# Patient Record
Sex: Male | Born: 1990 | Race: Black or African American | Hispanic: No | Marital: Single | Smoking: Never smoker
Health system: Southern US, Community
[De-identification: ages and names within clinical notes are randomized; demographics above are authoritative.]

## PROBLEM LIST (undated history)

## (undated) DIAGNOSIS — R109 Unspecified abdominal pain: Secondary | ICD-10-CM

## (undated) HISTORY — DX: Unspecified abdominal pain: R10.9

---
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
## 2010-01-04 ENCOUNTER — Encounter: Admission: RE | Admit: 2010-01-04 | Discharge: 2010-01-04 | Payer: Self-pay | Admitting: General Practice

## 2010-04-27 ENCOUNTER — Emergency Department (HOSPITAL_COMMUNITY)
Admission: EM | Admit: 2010-04-27 | Discharge: 2010-04-27 | Payer: Self-pay | Source: Home / Self Care | Admitting: Emergency Medicine

## 2010-06-22 ENCOUNTER — Emergency Department (HOSPITAL_COMMUNITY): Payer: Self-pay

## 2010-06-22 ENCOUNTER — Emergency Department (HOSPITAL_COMMUNITY)
Admission: EM | Admit: 2010-06-22 | Discharge: 2010-06-22 | Disposition: A | Discharge status: Home / Self Care | Payer: Self-pay | Attending: Emergency Medicine | Admitting: Emergency Medicine

## 2010-06-22 ENCOUNTER — Encounter (HOSPITAL_COMMUNITY): Payer: Self-pay | Admitting: Radiology

## 2010-06-22 DIAGNOSIS — R1031 Right lower quadrant pain: Secondary | ICD-10-CM | POA: Insufficient documentation

## 2010-06-22 DIAGNOSIS — R110 Nausea: Secondary | ICD-10-CM | POA: Insufficient documentation

## 2010-06-22 LAB — DIFFERENTIAL
Eosinophils Absolute: 0.3 10*3/uL (ref 0.0–0.7)
Eosinophils Relative: 7 % — ABNORMAL HIGH (ref 0–5)
Lymphs Abs: 1.7 10*3/uL (ref 0.7–4.0)
Neutro Abs: 1.5 10*3/uL — ABNORMAL LOW (ref 1.7–7.7)
Neutrophils Relative %: 38 % — ABNORMAL LOW (ref 43–77)

## 2010-06-22 LAB — BASIC METABOLIC PANEL
Creatinine, Ser: 0.71 mg/dL (ref 0.4–1.5)
GFR calc non Af Amer: >60 mL/min (ref >60)
Sodium: 136 mEq/L (ref 135–145)

## 2010-06-22 LAB — URINALYSIS, ROUTINE W REFLEX MICROSCOPIC
Glucose, UA: NEGATIVE mg/dL
Ketones, ur: NEGATIVE mg/dL
Protein, ur: NEGATIVE mg/dL
Specific Gravity, Urine: 1.029 (ref 1.005–1.030)
Urobilinogen, UA: 1 mg/dL (ref 0.0–1.0)

## 2010-06-22 LAB — CBC
HCT: 40.4 % (ref 39.0–52.0)
MCHC: 33.4 g/dL (ref 30.0–36.0)
Platelets: 214 10*3/uL (ref 150–400)
RBC: 4.9 MIL/uL (ref 4.22–5.81)
RDW: 12.7 % (ref 11.5–15.5)

## 2010-06-22 MED ORDER — IOHEXOL 300 MG/ML  SOLN
80.0000 mL | Freq: Once | INTRAMUSCULAR | Status: AC | PRN
  Administered 2010-06-22: 80 mL via INTRAVENOUS

## 2010-06-25 LAB — URINALYSIS, ROUTINE W REFLEX MICROSCOPIC
Hgb urine dipstick: NEGATIVE
Protein, ur: NEGATIVE mg/dL
Urobilinogen, UA: 0.2 mg/dL (ref 0.0–1.0)
pH: 7.5 (ref 5.0–8.0)

## 2010-06-25 LAB — GC/CHLAMYDIA PROBE AMP, GENITAL: GC Probe Amp, Genital: NEGATIVE

## 2010-09-21 ENCOUNTER — Emergency Department (HOSPITAL_COMMUNITY): Payer: Medicaid Other

## 2010-09-21 ENCOUNTER — Emergency Department (HOSPITAL_COMMUNITY)
Admission: EM | Admit: 2010-09-21 | Discharge: 2010-09-21 | Disposition: A | Discharge status: Home / Self Care | Payer: Medicaid Other | Attending: Emergency Medicine | Admitting: Emergency Medicine

## 2010-09-21 DIAGNOSIS — R1033 Periumbilical pain: Secondary | ICD-10-CM | POA: Insufficient documentation

## 2010-09-21 DIAGNOSIS — R110 Nausea: Secondary | ICD-10-CM | POA: Insufficient documentation

## 2010-09-21 LAB — COMPREHENSIVE METABOLIC PANEL
ALT: 15 U/L (ref 0–53)
AST: 24 U/L (ref 0–37)
Albumin: 4.2 g/dL (ref 3.5–5.2)
BUN: 16 mg/dL (ref 6–23)
Calcium: 9.3 mg/dL (ref 8.4–10.5)
GFR calc Af Amer: >60 mL/min (ref >60)
Potassium: 4 mEq/L (ref 3.5–5.1)
Total Bilirubin: 0.4 mg/dL (ref 0.3–1.2)
Total Protein: 7.6 g/dL (ref 6.0–8.3)

## 2010-09-21 LAB — CBC
Hemoglobin: 15.5 g/dL (ref 13.0–17.0)
MCH: 27.3 pg (ref 26.0–34.0)
MCHC: 33.1 g/dL (ref 30.0–36.0)
MCV: 82.5 fL (ref 78.0–100.0)

## 2010-09-21 LAB — URINALYSIS, ROUTINE W REFLEX MICROSCOPIC
Leukocytes, UA: NEGATIVE
Nitrite: NEGATIVE
Protein, ur: NEGATIVE mg/dL
Urobilinogen, UA: 0.2 mg/dL (ref 0.0–1.0)
pH: 6 (ref 5.0–8.0)

## 2010-09-21 MED ORDER — IOHEXOL 300 MG/ML  SOLN: 100.0000 mL | Freq: Once | INTRAMUSCULAR | Status: DC | PRN

## 2011-03-19 ENCOUNTER — Encounter (HOSPITAL_COMMUNITY): Payer: Self-pay | Admitting: Emergency Medicine

## 2011-03-19 ENCOUNTER — Emergency Department (HOSPITAL_COMMUNITY)
Admission: EM | Admit: 2011-03-19 | Discharge: 2011-03-19 | Disposition: A | Discharge status: Home / Self Care | Payer: Medicaid Other | Attending: Emergency Medicine | Admitting: Emergency Medicine

## 2011-03-19 ENCOUNTER — Emergency Department (HOSPITAL_COMMUNITY): Payer: Medicaid Other

## 2011-03-19 DIAGNOSIS — R059 Cough, unspecified: Secondary | ICD-10-CM | POA: Insufficient documentation

## 2011-03-19 DIAGNOSIS — J3489 Other specified disorders of nose and nasal sinuses: Secondary | ICD-10-CM | POA: Insufficient documentation

## 2011-03-19 DIAGNOSIS — R6889 Other general symptoms and signs: Secondary | ICD-10-CM | POA: Insufficient documentation

## 2011-03-19 DIAGNOSIS — R110 Nausea: Secondary | ICD-10-CM | POA: Insufficient documentation

## 2011-03-19 DIAGNOSIS — R05 Cough: Secondary | ICD-10-CM | POA: Insufficient documentation

## 2011-03-19 DIAGNOSIS — J111 Influenza due to unidentified influenza virus with other respiratory manifestations: Secondary | ICD-10-CM | POA: Insufficient documentation

## 2011-03-19 DIAGNOSIS — R509 Fever, unspecified: Secondary | ICD-10-CM | POA: Insufficient documentation

## 2011-03-19 DIAGNOSIS — R599 Enlarged lymph nodes, unspecified: Secondary | ICD-10-CM | POA: Insufficient documentation

## 2011-03-19 MED ORDER — PROMETHAZINE-DM 6.25-15 MG/5ML PO SYRP: 5.0000 mL | ORAL_SOLUTION | Freq: Four times a day (QID) | ORAL | Status: AC | PRN

## 2011-03-19 MED ORDER — GUAIFENESIN ER 1200 MG PO TB12: 1.0000 | ORAL_TABLET | Freq: Two times a day (BID) | ORAL | Status: DC

## 2011-03-19 MED ORDER — IBUPROFEN 800 MG PO TABS: 800.0000 mg | ORAL_TABLET | Freq: Three times a day (TID) | ORAL | Status: AC | PRN

## 2011-03-19 NOTE — ED Notes (Signed)
sorethroat cough since monday

## 2011-03-19 NOTE — ED Provider Notes (Signed)
Medical screening examination/treatment/procedure(s) were performed by non-physician practitioner and as supervising physician I was immediately available for consultation/collaboration.   Glynn Octave, MD 03/19/11 [phone]

## 2011-03-19 NOTE — ED Provider Notes (Signed)
History     CSN: [id number] Arrival date & time: 03/19/2011  7:59 AM   First MD Initiated Contact with Patient 03/19/11 [phone]      Chief Complaint  Patient presents with  . URI    (Consider location/radiation/quality/duration/timing/severity/associated sxs/prior treatment) Patient is a 20 y.o. male presenting with URI. The history is provided by the patient.  URI The primary symptoms include fever, sore throat, swollen glands, cough and nausea. Primary symptoms do not include fatigue, headaches, ear pain, wheezing, abdominal pain, vomiting, myalgias or rash.  Symptoms associated with the illness include congestion and rhinorrhea. The illness is not associated with chills.   Patient states the last 2 days he sets cough sore throat runny nose and nasal congestion along with nausea.  Patient denies chest pain shortness of breath, abdominal pain, vomiting, diarrhea, headache, or weakness.  Patient states he is taking ibuprofen for the chills and pain in his throat.  History reviewed. No pertinent past medical history.  History reviewed. No pertinent past surgical history.  No family history on file.  History  Substance Use Topics  . Smoking status: Not on file  . Smokeless tobacco: Not on file  . Alcohol Use: Not on file      Review of Systems  Constitutional: Positive for fever. Negative for chills and fatigue.  HENT: Positive for congestion, sore throat and rhinorrhea. Negative for ear pain.   Respiratory: Positive for cough. Negative for wheezing.   Gastrointestinal: Positive for nausea. Negative for vomiting and abdominal pain.  Musculoskeletal: Negative for myalgias.  Skin: Negative for rash.  Neurological: Negative for headaches.   All pertinent positives and negatives in the history of present illness  Allergies  Review of patient's allergies indicates no known allergies.  Home Medications   Current Outpatient Rx  Name Route Sig Dispense Refill  . IBUPROFEN 200  MG PO TABS Oral Take 400 mg by mouth every 6 (six) hours as needed.        Pulse 106  Temp(Src) 100.1 F (37.8 C) (Oral)  Resp 18  SpO2 99%  Physical Exam  Constitutional: He is oriented to person, place, and time. He appears well-developed and well-nourished. No distress.  HENT:  Head: Normocephalic and atraumatic. No trismus in the jaw.  Right Ear: Tympanic membrane normal.  Left Ear: Tympanic membrane normal.  Nose: Rhinorrhea present.  Mouth/Throat: Uvula is midline and mucous membranes are normal. No uvula swelling. No oropharyngeal exudate, posterior oropharyngeal edema, posterior oropharyngeal erythema or tonsillar abscesses.  Eyes: Pupils are equal, round, and reactive to light.  Cardiovascular: Normal rate, regular rhythm and normal heart sounds.   Pulmonary/Chest: Effort normal and breath sounds normal. He has no wheezes. He has no rales.  Abdominal: Soft. Bowel sounds are normal. He exhibits no distension. There is no tenderness. There is no rebound and no guarding.  Neurological: He is alert and oriented to person, place, and time.  Skin: Skin is warm and dry. No rash noted. He is not diaphoretic.    ED Course  Procedures (including critical care time)   Dg Chest 2 View  03/19/2011  *RADIOLOGY REPORT*  Clinical Data: Cough, fever.  CHEST - 2 VIEW  Comparison: None  Findings: Slight peribronchial thickening. Heart and mediastinal contours are within normal limits.  No focal opacities or effusions.  No acute bony abnormality.  IMPRESSION: Slight bronchitic changes.  Original Report Authenticated By: Cyndie Chime, M.D.     Patient has an influenza-like illness based on his history  of present illness and physical exam findings.  He has normal vital signs other than a low-grade temperature.  Patient is explained that this influenza-like illness and he will need to rest and increase his fluid intake.  He has been stable here in the emergency department during his  visit.    MDM  See above        Carlyle Dolly, PA-C 03/19/11 [phone]

## 2013-04-14 ENCOUNTER — Emergency Department (HOSPITAL_COMMUNITY)
Admission: EM | Admit: 2013-04-14 | Discharge: 2013-04-14 | Disposition: A | Discharge status: Home / Self Care | Payer: Medicaid Other | Attending: Emergency Medicine | Admitting: Emergency Medicine

## 2013-04-14 ENCOUNTER — Emergency Department (HOSPITAL_COMMUNITY): Payer: Medicaid Other

## 2013-04-14 ENCOUNTER — Encounter (HOSPITAL_COMMUNITY): Payer: Self-pay | Admitting: Emergency Medicine

## 2013-04-14 DIAGNOSIS — R61 Generalized hyperhidrosis: Secondary | ICD-10-CM | POA: Insufficient documentation

## 2013-04-14 DIAGNOSIS — J069 Acute upper respiratory infection, unspecified: Secondary | ICD-10-CM | POA: Insufficient documentation

## 2013-04-14 DIAGNOSIS — B9789 Other viral agents as the cause of diseases classified elsewhere: Secondary | ICD-10-CM

## 2013-04-14 DIAGNOSIS — J988 Other specified respiratory disorders: Secondary | ICD-10-CM

## 2013-04-14 DIAGNOSIS — R112 Nausea with vomiting, unspecified: Secondary | ICD-10-CM | POA: Insufficient documentation

## 2013-04-14 DIAGNOSIS — R0789 Other chest pain: Secondary | ICD-10-CM | POA: Insufficient documentation

## 2013-04-14 LAB — BASIC METABOLIC PANEL
BUN: 13 mg/dL (ref 6–23)
CALCIUM: 9.7 mg/dL (ref 8.4–10.5)
CHLORIDE: 97 meq/L (ref 96–112)
CO2: 28 mEq/L (ref 19–32)
CREATININE: 0.92 mg/dL (ref 0.50–1.35)
GLUCOSE: 104 mg/dL — AB (ref 70–99)
Potassium: 3.9 mEq/L (ref 3.7–5.3)
Sodium: 138 mEq/L (ref 137–147)

## 2013-04-14 LAB — CBC
HCT: 47 % (ref 39.0–52.0)
HEMOGLOBIN: 15.5 g/dL (ref 13.0–17.0)
MCH: 28.2 pg (ref 26.0–34.0)
MCHC: 33 g/dL (ref 30.0–36.0)
MCV: 85.5 fL (ref 78.0–100.0)
Platelets: 222 10*3/uL (ref 150–400)
RBC: 5.5 MIL/uL (ref 4.22–5.81)
RDW: 12.4 % (ref 11.5–15.5)
WBC: 4.8 10*3/uL (ref 4.0–10.5)

## 2013-04-14 MED ORDER — IBUPROFEN 800 MG PO TABS: 800.0000 mg | ORAL_TABLET | Freq: Three times a day (TID) | ORAL | Status: DC | PRN

## 2013-04-14 MED ORDER — ALBUTEROL SULFATE HFA 108 (90 BASE) MCG/ACT IN AERS
INHALATION_SPRAY | RESPIRATORY_TRACT | Status: AC
  Filled 2013-04-14: qty 6.7

## 2013-04-14 MED ORDER — HYDROCODONE-HOMATROPINE 5-1.5 MG/5ML PO SYRP
5.0000 mL | ORAL_SOLUTION | Freq: Four times a day (QID) | ORAL | Status: DC | PRN
Start: 2013-04-14 — End: 2013-07-29

## 2013-04-14 MED ORDER — ALBUTEROL SULFATE HFA 108 (90 BASE) MCG/ACT IN AERS
1.0000 | INHALATION_SPRAY | RESPIRATORY_TRACT | Status: DC | PRN
  Administered 2013-04-14: 2 via RESPIRATORY_TRACT

## 2013-04-14 MED ORDER — ALBUTEROL SULFATE HFA 108 (90 BASE) MCG/ACT IN AERS: 1.0000 | INHALATION_SPRAY | Freq: Four times a day (QID) | RESPIRATORY_TRACT | Status: DC | PRN

## 2013-04-14 MED ORDER — IBUPROFEN 800 MG PO TABS
800.0000 mg | ORAL_TABLET | Freq: Once | ORAL | Status: AC
  Administered 2013-04-14: 800 mg via ORAL
  Filled 2013-04-14: qty 1

## 2013-04-14 NOTE — ED Notes (Signed)
Pt c/o productive cough and chest pain with cough since Mon.  Denies fever.  Pt also c/o nausea when he coughs.

## 2013-04-14 NOTE — Discharge Instructions (Signed)
Read the information below.  Use the prescribed medication as directed.  Please discuss all new medications with your pharmacist.  You may return to the Emergency Department at any time for worsening condition or any new symptoms that concern you.  If you develop high fevers that do not resolve with tylenol or ibuprofen, you have difficulty swallowing or breathing, or you are unable to tolerate fluids by mouth, return to the ER for a recheck.    ° ° ° °Viral Infections °A viral infection can be caused by different types of viruses. Most viral infections are not serious and resolve on their own. However, some infections may cause severe symptoms and may lead to further complications. °SYMPTOMS °Viruses can frequently cause: °· Minor sore throat. °· Aches and pains. °· Headaches. °· Runny nose. °· Different types of rashes. °· Watery eyes. °· Tiredness. °· Cough. °· Loss of appetite. °· Gastrointestinal infections, resulting in nausea, vomiting, and diarrhea. °These symptoms do not respond to antibiotics because the infection is not caused by bacteria. However, you might catch a bacterial infection following the viral infection. This is sometimes called a "superinfection." Symptoms of such a bacterial infection may include: °· Worsening sore throat with pus and difficulty swallowing. °· Swollen neck glands. °· Chills and a high or persistent fever. °· Severe headache. °· Tenderness over the sinuses. °· Persistent overall ill feeling (malaise), muscle aches, and tiredness (fatigue). °· Persistent cough. °· Yellow, green, or brown mucus production with coughing. °HOME CARE INSTRUCTIONS  °· Only take over-the-counter or prescription medicines for pain, discomfort, diarrhea, or fever as directed by your caregiver. °· Drink enough water and fluids to keep your urine clear or pale yellow. Sports drinks can provide valuable electrolytes, sugars, and hydration. °· Get plenty of rest and maintain proper nutrition. Soups and  broths with crackers or rice are fine. °SEEK IMMEDIATE MEDICAL CARE IF:  °· You have severe headaches, shortness of breath, chest pain, neck pain, or an unusual rash. °· You have uncontrolled vomiting, diarrhea, or you are unable to keep down fluids. °· You or your child has an oral temperature above 102° F (38.9° C), not controlled by medicine. °· Your baby is older than 3 months with a rectal temperature of 102° F (38.9° C) or higher. °· Your baby is 3 months old or younger with a rectal temperature of 100.4° F (38° C) or higher. °MAKE SURE YOU:  °· Understand these instructions. °· Will watch your condition. °· Will get help right away if you are not doing well or get worse. °Document Released: 01/01/2005 Document Revised: 06/16/2011 Document Reviewed: 07/29/2010 °ExitCare® Patient Information ©2014 [employer]. ° °

## 2013-04-14 NOTE — ED Provider Notes (Signed)
CSN: [id number]     Arrival date & time 04/14/13  1449 History   First MD Initiated Contact with Patient 04/14/13 1745     Chief Complaint  Patient presents with  . Cough  . Chest Pain   (Consider location/radiation/quality/duration/timing/severity/associated sxs/prior Treatment) HPI Patient with cough, burning chest pain that began Monday.  Chest pain only occurs with cough, is 9/10, started after excessive coughing.  Pt has had rhinorrhea with yellow discharge, chills/sweats, nausea, and posttussive emesis.  Denies SOB, ear pain, sinusp ressure, headache, diarrhea.  No blood in his sputum.  Has had a sick contact at work with similar symptoms.  No recent travel.  No leg swelling.   History reviewed. No pertinent past medical history. History reviewed. No pertinent past surgical history. No family history on file. History  Substance Use Topics  . Smoking status: Never Smoker   . Smokeless tobacco: Not on file  . Alcohol Use: No    Review of Systems  Constitutional: Positive for chills and diaphoresis. Negative for fever.  HENT: Positive for congestion and rhinorrhea. Negative for ear pain, sinus pressure, sore throat and trouble swallowing.   Respiratory: Positive for cough. Negative for shortness of breath.   Cardiovascular: Positive for chest pain.  Gastrointestinal: Negative for nausea, vomiting, abdominal pain and diarrhea.  Allergic/Immunologic: Negative for immunocompromised state.  Neurological: Negative for headaches.    Allergies  Review of patient's allergies indicates no known allergies.  Home Medications   Current Outpatient Rx  Name  Route  Sig  Dispense  Refill  . albuterol (PROVENTIL HFA;VENTOLIN HFA) 108 (90 BASE) MCG/ACT inhaler   Inhalation   Inhale 1-2 puffs into the lungs every 6 (six) hours as needed for wheezing or shortness of breath.   1 Inhaler   0   . HYDROcodone-homatropine (HYCODAN) 5-1.5 MG/5ML syrup   Oral   Take 5 mLs by mouth every 6  (six) hours as needed for cough.   120 mL   0   . ibuprofen (ADVIL,MOTRIN) 800 MG tablet   Oral   Take 1 tablet (800 mg total) by mouth every 8 (eight) hours as needed for mild pain or moderate pain.   15 tablet   0    BP 129/55  Pulse 67  Temp(Src) 97.9 F (36.6 C) (Oral)  Resp 18  Ht 5\' 8"  (1.727 m)  Wt 157 lb (71.215 kg)  BMI 23.88 kg/m2  SpO2 100% Physical Exam  Nursing note and vitals reviewed. Constitutional: He appears well-developed and well-nourished. No distress.  HENT:  Head: Normocephalic and atraumatic.  Mouth/Throat: Oropharynx is clear and moist.  Neck: Neck supple.  Cardiovascular: Normal rate and regular rhythm.   Pulmonary/Chest: Effort normal and breath sounds normal. No respiratory distress. He has no wheezes. He has no rales.  Abdominal: Soft. He exhibits no distension and no mass. There is no tenderness. There is no rebound and no guarding.  Neurological: He is alert. He exhibits normal muscle tone.  Skin: He is not diaphoretic.    ED Course  Procedures (including critical care time) Labs Review Labs Reviewed  BASIC METABOLIC PANEL - Abnormal; Notable for the following:    Glucose, Bld 104 (*)    All other components within normal limits  CBC   Imaging Review Dg Chest 2 View (if Patient Has Fever And/or Copd)  04/14/2013   CLINICAL DATA:  Fever on Tuesday, currently no fever, with chest pain and cough for 4 days  EXAM: CHEST  2 VIEW  COMPARISON:  03/19/2011  FINDINGS: The heart size and mediastinal contours are within normal limits. Both lungs are clear. The visualized skeletal structures are unremarkable.  IMPRESSION: No active cardiopulmonary disease.   Electronically Signed   By: Esperanza Heiraymond  Rubner M.D.   On: 04/14/2013 16:11    EKG Interpretation   None       MDM   1. Viral respiratory illness    Afebrile, nontoxic patient with cough, nasal congestion/rhinorrhea, has developed burning chest pain only with coughing.  No SOB.  O2 97% on  room air.  CXR negative.  Likely viral illness.  D/C home with supportive care.  Albuterol, hycodan, ibuprofen. Discussed result, findings, treatment, and follow up  with patient.  Pt given return precautions.  Pt verbalizes understanding and agrees with plan.      I doubt any other EMC precluding discharge at this time including, but not necessarily limited to the following: PE, pneumonia     Trixie Dredgemily Ardyn Forge, PA-C 04/14/13 2010

## 2013-04-15 NOTE — ED Provider Notes (Signed)
Medical screening examination/treatment/procedure(s) were performed by non-physician practitioner and as supervising physician I was immediately available for consultation/collaboration.  EKG Interpretation    Date/Time:  Thursday April 14 2013 15:01:26 EST Ventricular Rate:  74 PR Interval:  146 QRS Duration: 80 QT Interval:  370 QTC Calculation: 410 R Axis:   78 Text Interpretation:  Normal sinus rhythm Normal ECG Confirmed by Lizania Bouchard  MD, Haadi Santellan ([id number]) on 04/15/2013 10:12:53 AM              Randa SpikeForrest Mort SawyersS Alizzon Dioguardi, MD 04/15/13 1013

## 2013-07-29 ENCOUNTER — Emergency Department (HOSPITAL_COMMUNITY)
Admission: EM | Admit: 2013-07-29 | Discharge: 2013-07-29 | Disposition: A | Discharge status: Home / Self Care | Payer: Medicaid Other | Attending: Emergency Medicine | Admitting: Emergency Medicine

## 2013-07-29 ENCOUNTER — Encounter (HOSPITAL_COMMUNITY): Payer: Self-pay | Admitting: Emergency Medicine

## 2013-07-29 DIAGNOSIS — B9689 Other specified bacterial agents as the cause of diseases classified elsewhere: Secondary | ICD-10-CM | POA: Insufficient documentation

## 2013-07-29 DIAGNOSIS — L03213 Periorbital cellulitis: Secondary | ICD-10-CM

## 2013-07-29 DIAGNOSIS — H1031 Unspecified acute conjunctivitis, right eye: Secondary | ICD-10-CM

## 2013-07-29 DIAGNOSIS — H05019 Cellulitis of unspecified orbit: Secondary | ICD-10-CM | POA: Insufficient documentation

## 2013-07-29 DIAGNOSIS — A499 Bacterial infection, unspecified: Secondary | ICD-10-CM | POA: Insufficient documentation

## 2013-07-29 DIAGNOSIS — H1030 Unspecified acute conjunctivitis, unspecified eye: Secondary | ICD-10-CM | POA: Insufficient documentation

## 2013-07-29 MED ORDER — HYDROCODONE-ACETAMINOPHEN 5-325 MG PO TABS: 1.0000 | ORAL_TABLET | Freq: Four times a day (QID) | ORAL | Status: DC | PRN

## 2013-07-29 MED ORDER — CEPHALEXIN 500 MG PO CAPS: 500.0000 mg | ORAL_CAPSULE | Freq: Four times a day (QID) | ORAL | Status: DC

## 2013-07-29 MED ORDER — OFLOXACIN 0.3 % OP SOLN
1.0000 [drp] | Freq: Four times a day (QID) | OPHTHALMIC | Status: DC
  Administered 2013-07-29: 1 [drp] via OPHTHALMIC
  Filled 2013-07-29 (×3): qty 5

## 2013-07-29 NOTE — ED Provider Notes (Signed)
CSN: [id number]     Arrival date & time 07/29/13  1238 History  This chart was scribed for non-physician practitioner, Arthor CaptainAbigail Gwenevere Goga, PA-C, working with Raeford RazorStephen Kohut, MD by Shari HeritageAisha Amuda, ED Scribe. This patient was seen in room TR04C/TR04C and the patient's care was started at 1:17 PM.  Chief Complaint  Patient presents with  . Eye Problem    The history is provided by the patient. No language interpreter was used.    HPI Comments: Lance SellJofred Dirusso is a 23 y.o. male who presents to the Emergency Department complaining of constant, moderate right eyelid pain that began 4 days ago upon waking. He states that pain is worse when he opens his eye. Patient denies any obvious injury or trauma to the eye. There is associated itching, swelling and redness to the eyelid. He is making tears from the right eye. He also reports subjective fever and chills 3 nights ago, but none since then. He denies visual changes, headaches, nausea, vomiting.    History reviewed. No pertinent past medical history. History reviewed. No pertinent past surgical history. No family history on file. History  Substance Use Topics  . Smoking status: Never Smoker   . Smokeless tobacco: Not on file  . Alcohol Use: No    Review of Systems  Constitutional: Positive for fever (subjective) and chills.  Eyes: Positive for pain, redness and itching. Negative for discharge and visual disturbance.  Gastrointestinal: Negative for nausea and vomiting.  Neurological: Negative for headaches.    Allergies  Review of patient's allergies indicates no known allergies.  Home Medications   Prior to Admission medications   Medication Sig Start Date End Date Taking? Authorizing Provider  albuterol (PROVENTIL HFA;VENTOLIN HFA) 108 (90 BASE) MCG/ACT inhaler Inhale 1-2 puffs into the lungs every 6 (six) hours as needed for wheezing or shortness of breath. 04/14/13   Trixie DredgeEmily West, PA-C  HYDROcodone-homatropine [facility](HYCODAN) 5-1.5 MG/5ML syrup Take  5 mLs by mouth every 6 (six) hours as needed for cough. 04/14/13   Trixie DredgeEmily West, PA-C  ibuprofen (ADVIL,MOTRIN) 800 MG tablet Take 1 tablet (800 mg total) by mouth every 8 (eight) hours as needed for mild pain or moderate pain. 04/14/13   Trixie DredgeEmily West, PA-C   Triage Vitals: BP 119/78  Pulse 68  Temp(Src) 98 F (36.7 C) (Oral)  SpO2 100% Physical Exam  Constitutional: He is oriented to person, place, and time. He appears well-developed and well-nourished.  HENT:  Head: Normocephalic and atraumatic.  Mouth/Throat: Oropharynx is clear and moist. No oropharyngeal exudate.  Eyes: EOM are normal.  Purulence under right eyelid. EOM intact without pain. Exam is difficult secondary to pain.  Neck: Normal range of motion. Neck supple.  Cardiovascular: Normal rate.   Pulmonary/Chest: Effort normal.  Musculoskeletal: Normal range of motion.  Neurological: He is alert and oriented to person, place, and time.  Skin: Skin is warm and dry. No rash noted.  Psychiatric: He has a normal mood and affect. His behavior is normal.    ED Course  Procedures (including critical care time) DIAGNOSTIC STUDIES: Oxygen Saturation is 100% on room air, normal by my interpretation.    COORDINATION OF CARE: 1:21 PM- Patient informed of current plan for treatment and evaluation and agrees with plan at this time.    MDM   Final diagnoses:  Acute bacterial conjunctivitis of right eye  Preseptal cellulitis of right eye    Patient presents with right eyelid pain, swelling and itching. Patient is producing tears from right eye. Able  to move eye without pain and purulence noted underneath right eyelid on physical exam. Will treat for bacterial conjunctivitis and preseptal cellulitis with ofloxacin. Patient advised of return precautions.   I personally performed the services described in this documentation, which was scribed in my presence. The recorded information has been reviewed and considered.  Arthor CaptainAbigail Trivia Heffelfinger,  PA-C 07/31/13 2001

## 2013-07-29 NOTE — ED Notes (Signed)
Woke up 2 days ago with right eyelid swelling and itching. Now very tender to touch.

## 2013-07-29 NOTE — Discharge Instructions (Signed)
Concerning your eye drops : Instill 1drops in affected eye every 4 hours for the first 2 days, then use 4 times/day for an additional 5 days.    Bacterial Conjunctivitis Bacterial conjunctivitis, commonly called pink eye, is an inflammation of the clear membrane that covers the white part of the eye (conjunctiva). The inflammation can also happen on the underside of the eyelids. The blood vessels in the conjunctiva become inflamed causing the eye to become red or pink. Bacterial conjunctivitis may spread easily from one eye to another and from person to person (contagious).  CAUSES  Bacterial conjunctivitis is caused by bacteria. The bacteria may come from your own skin, your upper respiratory tract, or from someone else with bacterial conjunctivitis. SYMPTOMS  The normally white color of the eye or the underside of the eyelid is usually pink or red. The pink eye is usually associated with irritation, tearing, and some sensitivity to light. Bacterial conjunctivitis is often associated with a thick, yellowish discharge from the eye. The discharge may turn into a crust on the eyelids overnight, which causes your eyelids to stick together. If a discharge is present, there may also be some blurred vision in the affected eye. DIAGNOSIS  Bacterial conjunctivitis is diagnosed by your caregiver through an eye exam and the symptoms that you report. Your caregiver looks for changes in the surface tissues of your eyes, which may point to the specific type of conjunctivitis. A sample of any discharge may be collected on a cotton-tip swab if you have a severe case of conjunctivitis, if your cornea is affected, or if you keep getting repeat infections that do not respond to treatment. The sample will be sent to a lab to see if the inflammation is caused by a bacterial infection and to see if the infection will respond to antibiotic medicines. TREATMENT   Bacterial conjunctivitis is treated with antibiotics.  Antibiotic eyedrops are most often used. However, antibiotic ointments are also available. Antibiotics pills are sometimes used. Artificial tears or eye washes may ease discomfort. HOME CARE INSTRUCTIONS   To ease discomfort, apply a cool, clean wash cloth to your eye for 10 20 minutes, 3 4 times a day.  Gently wipe away any drainage from your eye with a warm, wet washcloth or a cotton ball.  Wash your hands often with soap and water. Use paper towels to dry your hands.  Do not share towels or wash cloths. This may spread the infection.  Change or wash your pillow case every day.  You should not use eye makeup until the infection is gone.  Do not operate machinery or drive if your vision is blurred.  Stop using contacts lenses. Ask your caregiver how to sterilize or replace your contacts before using them again. This depends on the type of contact lenses that you use.  When applying medicine to the infected eye, do not touch the edge of your eyelid with the eyedrop bottle or ointment tube. SEEK IMMEDIATE MEDICAL CARE IF:   Your infection has not improved within 3 days after beginning treatment.  You had yellow discharge from your eye and it returns.  You have increased eye pain.  Your eye redness is spreading.  Your vision becomes blurred.  You have a fever or persistent symptoms for more than 2 3 days.  You have a fever and your symptoms suddenly get worse.  You have facial pain, redness, or swelling. MAKE SURE YOU:   Understand these instructions.  Will watch your condition.  Will get help right away if you are not doing well or get worse. Document Released: 03/24/2005 Document Revised: 12/17/2011 Document Reviewed: 08/25/2011 [facility] Patient Information 2014 [place], [place].  Periorbital Cellulitis Periorbital cellulitis is a common infection that can affect the eyelid and the soft tissues that surround the eyeball. The infection may also affect the structures that  produce and drain tears. It does not affect the eyeball itself. Natural tissue barriers usually prevent the spread of this infection to the eyeball and other deeper areas of the eye socket.  CAUSES  Bacterial infection.  Long-term (chronic) sinus infections.  An object (foreign body) stuck behind the eye.  An injury that goes through the eyelid tissues.  An injury that causes an infection, such as an insect sting.  Fracture of the bone around the eye.  Infections which have spread from the eyelid or other structures around the eye.  Bite wounds.  Inflammation or infection of the lining membranes of the brain (meningitis).  An infection in the blood (septicemia).  Dental infection (abscess).  Viral infection (this is rare). SYMPTOMS Symptoms usually come on suddenly.  Pain in the eye.  Red, hot, and swollen eyelids and possibly cheeks. The swelling is sometimes bad enough that the eyelids cannot open. Some infections make the eyelids look purple.  Fever and feeling generally ill.  Pain when touching the area around the eye. DIAGNOSIS  Periorbital cellulitis can be diagnosed from an eye exam. In severe cases, your caregiver might suggest:  Blood tests.  Imaging tests (such as a CT scan) to examine the sinuses and the area around and behind the eyeball. TREATMENT If your caregiver feels that you do not have any signs of serious infection, treatment may include:  Antibiotics.  Nasal decongestants to reduce swelling.  Referral to a dentist if it is suspected that the infection was caused by a prior tooth infection.  Examination every day to make sure the problem is improving. HOME CARE INSTRUCTIONS  Take your antibiotics as directed. Finish them even if you start to feel better.  Some pain is normal with this condition. Take pain medicine as directed by your caregiver. Only take pain medicines approved by your caregiver.  It is important to drink fluids. Drink  enough water and fluids to keep your urine clear or pale yellow.  Do not smoke.  Rest and get plenty of sleep.  Mild or moderate fevers generally have no long-term effects and often do not require treatment.  If your caregiver has given you a follow-up appointment, it is very important to keep that appointment. Your caregiver will need to make sure that the infection is getting better. It is important to check that a more serious infection is not developing. SEEK IMMEDIATE MEDICAL CARE IF:  Your eyelids become more painful, red, warm, or swollen.  You develop double vision or your vision becomes blurred or worsens in any way.  You have trouble moving your eyes.  The eye looks like it is popping out (proptosis).  You develop a severe headache, severe neck pain, or neck stiffness.  You develop repeated vomiting.  You have a fever or persistent symptoms for more than 72 hours.  You have a fever and your symptoms suddenly get worse. MAKE SURE YOU:  Understand these instructions.  Will watch your condition.  Will get help right away if you are not doing well or get worse. Document Released: 04/26/2010 Document Revised: 06/16/2011 Document Reviewed: 04/26/2010 [facility] CenterExit[facility] Patient Information 2014 [place]ExitCare, [place].

## 2013-07-29 NOTE — ED Notes (Signed)
Called down to pharmacy about eye gtts.

## 2013-08-02 NOTE — ED Provider Notes (Signed)
Medical screening examination/treatment/procedure(s) were performed by non-physician practitioner and as supervising physician I was immediately available for consultation/collaboration.   EKG Interpretation None       Raeford RazorStephen Larah Kuntzman, MD 08/02/13 1446

## 2015-12-27 ENCOUNTER — Ambulatory Visit (INDEPENDENT_AMBULATORY_CARE_PROVIDER_SITE_OTHER): Payer: PRIVATE HEALTH INSURANCE | Admitting: Urology

## 2015-12-27 ENCOUNTER — Encounter: Payer: Self-pay | Admitting: Urology

## 2015-12-27 VITALS — BP 128/61 | HR 62 | Ht 69.0 in | Wt 151.3 lb

## 2015-12-27 DIAGNOSIS — M545 Low back pain, unspecified: Secondary | ICD-10-CM

## 2015-12-27 DIAGNOSIS — R809 Proteinuria, unspecified: Secondary | ICD-10-CM | POA: Diagnosis not present

## 2015-12-27 LAB — URINALYSIS, COMPLETE
BILIRUBIN UA: NEGATIVE
Glucose, UA: NEGATIVE
Ketones, UA: NEGATIVE
Leukocytes, UA: NEGATIVE
Nitrite, UA: NEGATIVE
PH UA: 5.5 (ref 5.0–7.5)
RBC, UA: NEGATIVE
Specific Gravity, UA: >1.03 — ABNORMAL HIGH (ref 1.005–1.030)
UUROB: 2 mg/dL — AB (ref 0.2–1.0)

## 2015-12-27 LAB — MICROSCOPIC EXAMINATION
Bacteria, UA: NONE SEEN
Epithelial Cells (non renal): NONE SEEN /hpf (ref 0–10)

## 2015-12-27 NOTE — Progress Notes (Signed)
12/27/2015 8:53 AM   Yancey Newman Pies 23-Mar-1991 [id number]  Referring provider: Jonna Clark, MD [address] [place], [place] [address]  Chief Complaint  Patient presents with  . New Patient (Initial Visit)    proteinuria and flank pain referred by [facility]    HPI: Patient is a 25 year old [place] male who was seen at [facility] and was told he had protein in his urine and needed to follow up with urology.    He went to the [facility] after experiencing right flank pain that radiated into the abdomen with a foul smell to his urine.  He had been having unprotected sex and was worried that he may have contracted a STI.   STI testing returned negative, but he had protein on his urine dip.  He was instructed to follow up with urology for the right flank pain.  Today, he is not experiencing any difficulty with urination.  He has not had any penile discharge.  He has not experienced dysuria, gross hematuria or suprapubic pain.  He is not experiencing fevers, chills, nausea or vomiting.  When asked about his pain, he states he has had the pain off and on for several years.  He had attributed it to playing soccer.  He is now very worried as he was told he might have something wrong with his kidneys.  I had him point to the area on his back were he is experiencing the pain.  He pointed to the lower left back at this visit.  He does not have radiation into his lower legs.  He has not had a history of kidney stones and he denies any history of kidney stones in his family.  His UA did not demonstrate any casts or other significant findings.  He had a trace of protein on the dip, but he had 0-5 WBC's, 0-2 RBC's and mucus in the sample.     PMH: Past Medical History:  Diagnosis Date  . Flank pain     Surgical History: History reviewed. No pertinent surgical history.  Home Medications:    Medication List       Accurate as of 12/27/15 11:59 PM. Always use your most recent med list.            cephALEXin 500 MG capsule Commonly known as:  KEFLEX Take 1 capsule (500 mg total) by mouth 4 (four) times daily.   HYDROcodone-acetaminophen 5-325 MG tablet Commonly known as:  NORCO Take 1-2 tablets by mouth every 6 (six) hours as needed.       Allergies: No Known Allergies  Family History: Family History  Problem Relation Age of Onset  . Kidney disease Neg Hx   . Prostate cancer Neg Hx     Social History:  reports that he has never smoked. He has never used smokeless tobacco. He reports that he does not drink alcohol or use drugs.  ROS: UROLOGY Frequent Urination?: No Hard to postpone urination?: No Burning/pain with urination?: No Get up at night to urinate?: No Leakage of urine?: No Urine stream starts and stops?: No Trouble starting stream?: No Do you have to strain to urinate?: No Blood in urine?: No Urinary tract infection?: No Sexually transmitted disease?: No Injury to kidneys or bladder?: No Painful intercourse?: No Weak stream?: No Erection problems?: No Penile pain?: No  Gastrointestinal Nausea?: No Vomiting?: No Indigestion/heartburn?: No Diarrhea?: No Constipation?: No  Constitutional Fever: No Night sweats?: No Weight loss?: No Fatigue?:  No  Skin Skin rash/lesions?: No Itching?: No  Eyes Blurred vision?: No Double vision?: No  Ears/Nose/Throat Sore throat?: No Sinus problems?: No  Hematologic/Lymphatic Swollen glands?: No Easy bruising?: No  Cardiovascular Leg swelling?: No Chest pain?: No  Respiratory Cough?: No Shortness of breath?: No  Endocrine Excessive thirst?: No  Musculoskeletal Back pain?: Yes Joint pain?: No  Neurological Headaches?: No Dizziness?: No  Psychologic Depression?: No Anxiety?: No  Physical Exam: BP 128/61   Pulse 62   Ht 5\' 9"  (1.753 m)   Wt 151 lb 4.8 oz (68.6 kg)   BMI 22.34 kg/m   Constitutional: Well nourished. Alert and oriented, No acute distress. HEENT: [place]  AT, moist mucus membranes. Trachea midline, no masses. Cardiovascular: No clubbing, cyanosis, or edema. Respiratory: Normal respiratory effort, no increased work of breathing. GI: Abdomen is soft, non tender, non distended, no abdominal masses. Liver and spleen not palpable.  No hernias appreciated.  Stool sample for occult testing is not indicated.   GU: No CVA tenderness.  No bladder fullness or masses.  Patient with circumcised phallus.   Urethral meatus is patent.  No penile discharge. No penile lesions or rashes. Scrotum without lesions, cysts, rashes and/or edema.  Testicles are located scrotally bilaterally. No masses are appreciated in the testicles. Left and right epididymis are normal. Rectal: Deferred.   Skin: No rashes, bruises or suspicious lesions. Lymph: No cervical or inguinal adenopathy. Neurologic: Grossly intact, no focal deficits, moving all 4 extremities. Psychiatric: Normal mood and affect.  Patient started to complain that his back was starting to hurt after the exam.    Laboratory Data: Lab Results  Component Value Date   WBC 4.8 04/14/2013   HGB 15.5 04/14/2013   HCT 47.0 04/14/2013   MCV 85.5 04/14/2013   PLT 222 04/14/2013    Lab Results  Component Value Date   CREATININE 0.91 12/27/2015    Lab Results  Component Value Date   AST 15 12/27/2015   Lab Results  Component Value Date   ALT 14 12/27/2015    Urinalysis Unremarkable.  See EPIC and HPI.    Assessment & Plan:    1. Lower back pain  - today's visit patient complained of lower left back pain  - question of musculoskeletal origin as pain increased when he stood for exam    2. Proteinuria  - no casts seen in UA  - Urinalysis, Complete   - CULTURE, URINE COMPREHENSIVE  - check CMP   Return for pending labs.  These notes generated with voice recognition software. I apologize for typographical errors.  Michiel CowboySHANNON Kalecia Hartney, PA-C  [facility] [address], [address] [place], [place] [address] [phone]

## 2015-12-28 ENCOUNTER — Telehealth: Payer: Self-pay

## 2015-12-28 DIAGNOSIS — R109 Unspecified abdominal pain: Secondary | ICD-10-CM

## 2015-12-28 LAB — COMPREHENSIVE METABOLIC PANEL
A/G RATIO: 1.7 (ref 1.2–2.2)
ALT: 14 IU/L (ref 0–44)
AST: 15 IU/L (ref 0–40)
Albumin: 4.6 g/dL (ref 3.5–5.5)
Alkaline Phosphatase: 86 IU/L (ref 39–117)
BUN/Creatinine Ratio: 15 (ref 9–20)
BUN: 14 mg/dL (ref 6–20)
Bilirubin Total: 0.7 mg/dL (ref 0.0–1.2)
CO2: 27 mmol/L (ref 18–29)
CREATININE: 0.91 mg/dL (ref 0.76–1.27)
Calcium: 9.9 mg/dL (ref 8.7–10.2)
Chloride: 99 mmol/L (ref 96–106)
GFR calc non Af Amer: 118 mL/min/{1.73_m2} (ref >59)
GFR, EST AFRICAN AMERICAN: 136 mL/min/{1.73_m2} (ref >59)
Globulin, Total: 2.7 g/dL (ref 1.5–4.5)
Glucose: 82 mg/dL (ref 65–99)
POTASSIUM: 4.1 mmol/L (ref 3.5–5.2)
Sodium: 141 mmol/L (ref 134–144)
Total Protein: 7.3 g/dL (ref 6.0–8.5)

## 2015-12-28 NOTE — Telephone Encounter (Signed)
Spoke with pt in reference to RUS and flank pain. Pt voiced understanding.

## 2015-12-28 NOTE — Telephone Encounter (Signed)
-----   Message from Harle BattiestShannon A McGowan, PA-C sent at 12/28/2015  8:36 AM EDT ----- Patient's blood work is normal.

## 2015-12-28 NOTE — Telephone Encounter (Signed)
Spoke with pt in reference to lab results. Pt stated that he continues to have pain in his back. Please advise.

## 2015-12-28 NOTE — Telephone Encounter (Signed)
The pain is mostly likely musculoskeletal.  We can obtain a RUS to take an internal look of his kidneys.

## 2015-12-31 ENCOUNTER — Encounter: Payer: Self-pay | Admitting: Urology

## 2015-12-31 ENCOUNTER — Telehealth: Payer: Self-pay

## 2015-12-31 LAB — CULTURE, URINE COMPREHENSIVE

## 2015-12-31 NOTE — Telephone Encounter (Signed)
Spoke with pt in reference to ucx results. Pt voiced understanding.  

## 2015-12-31 NOTE — Telephone Encounter (Signed)
-----   Message from Harle BattiestShannon A McGowan, PA-C sent at 12/31/2015 12:52 PM EDT ----- This is a skin contaminate and does not need to be treated.

## 2016-05-21 ENCOUNTER — Other Ambulatory Visit: Payer: Self-pay

## 2017-12-08 ENCOUNTER — Emergency Department: Payer: Self-pay

## 2017-12-08 ENCOUNTER — Encounter: Payer: Self-pay | Admitting: Emergency Medicine

## 2017-12-08 ENCOUNTER — Inpatient Hospital Stay
Admission: EM | Admit: 2017-12-08 | Discharge: 2017-12-09 | DRG: 153 | Disposition: A | Discharge status: Home / Self Care | Payer: Self-pay | Attending: Family Medicine | Admitting: Family Medicine

## 2017-12-08 ENCOUNTER — Other Ambulatory Visit: Payer: Self-pay

## 2017-12-08 DIAGNOSIS — J020 Streptococcal pharyngitis: Principal | ICD-10-CM | POA: Diagnosis present

## 2017-12-08 DIAGNOSIS — R509 Fever, unspecified: Secondary | ICD-10-CM | POA: Diagnosis present

## 2017-12-08 LAB — CBC WITH DIFFERENTIAL/PLATELET
BASOS PCT: 0 %
Basophils Absolute: 0 10*3/uL (ref 0–0.1)
EOS ABS: 0 10*3/uL (ref 0–0.7)
Eosinophils Relative: 0 %
HCT: 41.9 % (ref 40.0–52.0)
Hemoglobin: 14 g/dL (ref 13.0–18.0)
LYMPHS PCT: 5 %
Lymphs Abs: 0.4 10*3/uL — ABNORMAL LOW (ref 1.0–3.6)
MCH: 27.5 pg (ref 26.0–34.0)
MCHC: 33.4 g/dL (ref 32.0–36.0)
MCV: 82.1 fL (ref 80.0–100.0)
MONO ABS: 0.5 10*3/uL (ref 0.2–1.0)
Monocytes Relative: 6 %
Neutro Abs: 7.2 10*3/uL — ABNORMAL HIGH (ref 1.4–6.5)
Neutrophils Relative %: 89 %
Platelets: 183 10*3/uL (ref 150–440)
RBC: 5.1 MIL/uL (ref 4.40–5.90)
RDW: 13.5 % (ref 11.5–14.5)
WBC: 8.2 10*3/uL (ref 3.8–10.6)

## 2017-12-08 LAB — COMPREHENSIVE METABOLIC PANEL
ALBUMIN: 4.6 g/dL (ref 3.5–5.0)
ALT: 23 U/L (ref 0–44)
AST: 26 U/L (ref 15–41)
Alkaline Phosphatase: 77 U/L (ref 38–126)
Anion gap: 8 (ref 5–15)
BUN: 18 mg/dL (ref 6–20)
CHLORIDE: 103 mmol/L (ref 98–111)
CO2: 25 mmol/L (ref 22–32)
CREATININE: 1.17 mg/dL (ref 0.61–1.24)
Calcium: 9.7 mg/dL (ref 8.9–10.3)
GFR calc Af Amer: >60 mL/min (ref >60)
GFR calc non Af Amer: >60 mL/min (ref >60)
GLUCOSE: 115 mg/dL — AB (ref 70–99)
POTASSIUM: 3.7 mmol/L (ref 3.5–5.1)
SODIUM: 136 mmol/L (ref 135–145)
Total Bilirubin: 1.2 mg/dL (ref 0.3–1.2)
Total Protein: 8.1 g/dL (ref 6.5–8.1)

## 2017-12-08 LAB — URINALYSIS, COMPLETE (UACMP) WITH MICROSCOPIC
BILIRUBIN URINE: NEGATIVE
Bacteria, UA: NONE SEEN
Glucose, UA: NEGATIVE mg/dL
HGB URINE DIPSTICK: NEGATIVE
Ketones, ur: 5 mg/dL — AB
Leukocytes, UA: NEGATIVE
Nitrite: NEGATIVE
Protein, ur: NEGATIVE mg/dL
SPECIFIC GRAVITY, URINE: 1.025 (ref 1.005–1.030)
pH: 8 (ref 5.0–8.0)

## 2017-12-08 LAB — LACTIC ACID, PLASMA
LACTIC ACID, VENOUS: 1.4 mmol/L (ref 0.5–1.9)
LACTIC ACID, VENOUS: 2.4 mmol/L — AB (ref 0.5–1.9)

## 2017-12-08 LAB — TECHNOLOGIST SMEAR REVIEW

## 2017-12-08 MED ORDER — BISACODYL 5 MG PO TBEC: 5.0000 mg | DELAYED_RELEASE_TABLET | Freq: Every day | ORAL | Status: DC | PRN

## 2017-12-08 MED ORDER — SODIUM CHLORIDE 0.9 % IV BOLUS
1000.0000 mL | Freq: Once | INTRAVENOUS | Status: AC
  Administered 2017-12-08: 1000 mL via INTRAVENOUS

## 2017-12-08 MED ORDER — ONDANSETRON HCL 4 MG PO TABS: 4.0000 mg | ORAL_TABLET | Freq: Four times a day (QID) | ORAL | Status: DC | PRN

## 2017-12-08 MED ORDER — ACETAMINOPHEN 325 MG PO TABS
650.0000 mg | ORAL_TABLET | Freq: Four times a day (QID) | ORAL | Status: DC | PRN
  Administered 2017-12-09: 650 mg via ORAL
  Filled 2017-12-08: qty 2

## 2017-12-08 MED ORDER — ACETAMINOPHEN 650 MG RE SUPP: 650.0000 mg | Freq: Four times a day (QID) | RECTAL | Status: DC | PRN

## 2017-12-08 MED ORDER — IBUPROFEN 600 MG PO TABS
600.0000 mg | ORAL_TABLET | Freq: Once | ORAL | Status: AC
  Administered 2017-12-08: 600 mg via ORAL
  Filled 2017-12-08: qty 1

## 2017-12-08 MED ORDER — SODIUM CHLORIDE 0.9 % IV SOLN
INTRAVENOUS | Status: DC
  Administered 2017-12-09 (×2): via INTRAVENOUS

## 2017-12-08 MED ORDER — DOCUSATE SODIUM 100 MG PO CAPS: 100.0000 mg | ORAL_CAPSULE | Freq: Two times a day (BID) | ORAL | Status: DC

## 2017-12-08 MED ORDER — HYDROCODONE-ACETAMINOPHEN 5-325 MG PO TABS: 1.0000 | ORAL_TABLET | ORAL | Status: DC | PRN

## 2017-12-08 MED ORDER — IOPAMIDOL (ISOVUE-300) INJECTION 61%
30.0000 mL | Freq: Once | INTRAVENOUS | Status: AC | PRN
  Administered 2017-12-08: 30 mL via ORAL

## 2017-12-08 MED ORDER — ONDANSETRON HCL 4 MG/2ML IJ SOLN: 4.0000 mg | Freq: Four times a day (QID) | INTRAMUSCULAR | Status: DC | PRN

## 2017-12-08 MED ORDER — HEPARIN SODIUM (PORCINE) 5000 UNIT/ML IJ SOLN
5000.0000 [IU] | Freq: Three times a day (TID) | INTRAMUSCULAR | Status: DC
  Filled 2017-12-08: qty 1

## 2017-12-08 MED ORDER — IOPAMIDOL (ISOVUE-300) INJECTION 61%
100.0000 mL | Freq: Once | INTRAVENOUS | Status: AC | PRN
  Administered 2017-12-08: 100 mL via INTRAVENOUS

## 2017-12-08 NOTE — ED Provider Notes (Addendum)
[facility] Emergency Department Provider Note  ____________________________________________   I have reviewed the triage vital signs and the nursing notes. Where available I have reviewed prior notes and, if possible and indicated, outside hospital notes.    HISTORY  Chief Complaint Fever and Nausea    HPI Roger Walker is a 27 y.o. male who is healthy at baseline, states he was recently in [place], [place]  Patient states he took no prophylaxis for malaria and had malaria x1 while he was there.  Been feeling good since he came home, he left there on July 29 which is outside of the end of Ebola, and has been doing well since then.  He has not been around anyone who has been sick since then.  Yesterday he began to have chills and today he had some vomiting nonbloody, no diarrhea, he states that he has had a fever since yesterday.  Denies significant headache.  Feels that he likely has malaria again.  He was seen at [facility] urgent care who found him to have a fever of 103, and they placed him a private vehicle and send him to the [facility] where he came in.  On arrival, I was already talking to Dr. Marcha Dutton of our factious disease program.  She feels strongly that as the patient's last known exposure to [place] in general was July 29 and as he has not been around anyone else who is sick, and as he was not in an endemic area, we do not need to worry about ebola for this patient No known Ebola exposure   Past Medical History:  Diagnosis Date  . Flank pain     There are no active problems to display for this patient.   History reviewed. No pertinent surgical history.  Prior to Admission medications   Not on File    Allergies Patient has no known allergies.  Family History  Problem Relation Age of Onset  . Kidney disease Neg Hx   . Prostate cancer Neg Hx     Social History Social  History   Tobacco Use  . Smoking status: Never Smoker  . Smokeless tobacco: Never Used  Substance Use Topics  . Alcohol use: No  . Drug use: No    Review of Systems Constitutional: + fever/chills Eyes: No visual changes. ENT: No sore throat. No stiff neck no neck pain Cardiovascular: Denies chest pain. Respiratory: Denies shortness of breath. Gastrointestinal:   + vomiting.  Hematemesis no diarrhea.  No constipation. Genitourinary: Negative for dysuria. Musculoskeletal: Negative lower extremity swelling Skin: Negative for rash. Neurological: Negative for severe headaches, focal weakness or numbness.   ____________________________________________   PHYSICAL EXAM:  VITAL SIGNS: ED Triage Vitals  Enc Vitals Group     BP 12/08/17 1730 126/75     Pulse Rate 12/08/17 1732 92     Resp 12/08/17 1730 (!) 27     Temp 12/08/17 1732 (!) 103.1 F (39.5 C)     Temp src --      SpO2 12/08/17 1732 100 %     Weight 12/08/17 1734 179 lb (81.2 kg)     Height --      Head Circumference --      Peak Flow --      Pain Score 12/08/17 1738 10     Pain Loc --      Pain Edu? --      Excl. in GC? --  Constitutional: Alert and oriented. Well appearing and in no acute distress. Eyes: Conjunctivae are normal Head: Atraumatic HEENT: No congestion/rhinnorhea. Mucous membranes are moist.  Oropharynx non-erythematous Neck:   Nontender with no meningismus, no masses, no stridor Cardiovascular: Normal rate, regular rhythm. Grossly normal heart sounds.  Good peripheral circulation. Respiratory: Normal respiratory effort.  No retractions. Lungs CTAB. Abdominal: Soft and focal tenderness noted to the right lower quadrant. No distention. No guarding no rebound Back:  There is no focal tenderness or step off.  there is no midline tenderness there are no lesions noted. there is no CVA tenderness Musculoskeletal: No lower extremity tenderness, no upper extremity tenderness. No joint effusions, no  DVT signs strong distal pulses no edema Neurologic:  Normal speech and language. No gross focal neurologic deficits are appreciated.  Skin:  Skin is warm, dry and intact. No rash noted. Psychiatric: Mood and affect are normal. Speech and behavior are normal.  ____________________________________________   LABS (all labs ordered are listed, but only abnormal results are displayed)  Labs Reviewed  CULTURE, BLOOD (ROUTINE X 2)  CULTURE, BLOOD (ROUTINE X 2)  URINE CULTURE  LACTIC ACID, PLASMA  LACTIC ACID, PLASMA  CBC WITH DIFFERENTIAL/PLATELET  COMPREHENSIVE METABOLIC PANEL  TECHNOLOGIST SMEAR REVIEW  URINALYSIS, COMPLETE (UACMP) WITH MICROSCOPIC    Pertinent labs  results that were available during my care of the patient were reviewed by me and considered in my medical decision making (see chart for details). ____________________________________________  EKG  I personally interpreted any EKGs ordered by me or triage  ____________________________________________  RADIOLOGY  Pertinent labs & imaging results that were available during my care of the patient were reviewed by me and considered in my medical decision making (see chart for details). If possible, patient and/or family made aware of any abnormal findings.  No results found. ____________________________________________    PROCEDURES  Procedure(s) performed: None  Procedures  Critical Care performed: None  ____________________________________________   INITIAL IMPRESSION / ASSESSMENT AND PLAN / ED COURSE  Pertinent labs & imaging results that were available during my care of the patient were reviewed by me and considered in my medical decision making (see chart for details).  Patient here with a recent trip to [place] with fever and vomiting.  Unclear etiology.  Did have recent malaria could certainly be malaria.  Also could just be a transitory viral illness.  What is not thought likely is Ebola, nonetheless  we are keeping him on precautions including respiratory and contact while we do blood work.  Vital signs are reassuring.  He already received antipyretics and will give him more.  We will give him IV fluid.  Very much appreciate the assistance with infectious disease.  Patient is in a negative pressure room.  This may serve as a good test of our systems if nothing else.  Patient clinically does not appear to be significantly toxic.  We will check after discussion with infectious disease and including CBC and cultures and we will reassess.  She does have focal right lower quadrant pain, he does not have a surgical abdomen but he will require imaging to rule out appendicitis as well  ----------------------------------------- 9:26 PM on 12/08/2017 -----------------------------------------  D/w dr. Rivka Safer, who agrees w/ mgt and no abx at this time. He is feeling better.   ____________________________________________   FINAL CLINICAL IMPRESSION(S) / ED DIAGNOSES  Final diagnoses:  None      This chart was dictated using voice recognition software.  Despite best efforts to proofread,  errors can occur which can change meaning.      Jeanmarie Plant, MD 12/08/17 1807    Jeanmarie Plant, MD 12/08/17 Merrily Brittle    Jeanmarie Plant, MD 12/08/17 1816    Jeanmarie Plant, MD 12/08/17 2129

## 2017-12-08 NOTE — H&P (Signed)
[facility] [place] [facility]   PATIENT NAME: Roger Walker    MR#:  [id number]  DATE OF BIRTH:  27-Jun-1990  DATE OF ADMISSION:  12/08/2017  PRIMARY CARE PHYSICIAN: Patient, No Pcp Per   REQUESTING/REFERRING PHYSICIAN:   CHIEF COMPLAINT:   Chief Complaint  Patient presents with  . Fever  . Nausea    HISTORY OF PRESENT ILLNESS: Roger Walker  is a 27 y.o. male without significant medical history. Patient presented to emergency room for acute onset of nausea vomiting, fever and chills going on for the past 24 hours.  The highest temperature at home was 103.  No bleeding, no diarrhea.  No recent antibiotic use.  He was recently visiting [place], in the [place], until July 29.  Patient did not take any prophylactic medication for malaria and he actually did develop malaria once, while he was there.  He was treated for that episode and he has been doing well since he came back to [place].  Patient denies any sick contacts, including sick contacts with Ebola virus. Blood test done emergency room including CBC and CMP are grossly unremarkable.  Lactic acid level is 1.4.  UA is negative for UTI.  Chest x-ray is negative for any acute cardiopulmonary abnormalities. Abdominal CAT scan is negative for any acute abnormalities. Patient is admitted for further evaluation and treatment.   PAST MEDICAL HISTORY:   Past Medical History:  Diagnosis Date  . Flank pain     PAST SURGICAL HISTORY: History reviewed. No pertinent surgical history.  SOCIAL HISTORY:  Social History   Tobacco Use  . Smoking status: Never Smoker  . Smokeless tobacco: Never Used  Substance Use Topics  . Alcohol use: No    FAMILY HISTORY:  Family History  Problem Relation Age of Onset  . Kidney disease Neg Hx   . Prostate cancer Neg Hx     DRUG ALLERGIES: No Known Allergies  REVIEW OF SYSTEMS:   CONSTITUTIONAL: Positive for fever, chills, fatigue and generalized  weakness.  EYES: No changes in vision.  EARS, NOSE, AND THROAT: No tinnitus or ear pain.  RESPIRATORY: No cough, shortness of breath, wheezing or hemoptysis.  CARDIOVASCULAR: No chest pain, orthopnea, edema.  GASTROINTESTINAL: Positive for nausea, vomiting, no diarrhea or abdominal pain.  GENITOURINARY: No dysuria, hematuria.  ENDOCRINE: No polyuria, nocturia. HEMATOLOGY: No bleeding. SKIN: No rash or lesion. MUSCULOSKELETAL: No joint pain at this time.   NEUROLOGIC: No focal weakness.  PSYCHIATRY: No anxiety or depression.   MEDICATIONS AT HOME:  Prior to Admission medications   Not on File      PHYSICAL EXAMINATION:   VITAL SIGNS: Blood pressure 127/60, pulse 86, temperature 100.3 F (37.9 C), temperature source Oral, resp. rate (!) 26, height 5\' 7"  (1.702 m), weight 81.2 kg, SpO2 96 %.  GENERAL:  27 y.o.-year-old patient lying in the bed with no acute distress.  EYES: Pupils equal, round, reactive to light and accommodation. No scleral icterus. Extraocular muscles intact.  HEENT: Head atraumatic, normocephalic. Oropharynx and nasopharynx clear.  NECK:  Supple, no jugular venous distention. No thyroid enlargement, no tenderness.  LUNGS: Normal breath sounds bilaterally, no wheezing, rales,rhonchi or crepitation. No use of accessory muscles of respiration.  CARDIOVASCULAR: S1, S2 normal. No S3/S4.  ABDOMEN: Soft, nontender, nondistended. Bowel sounds present. No organomegaly or mass.  EXTREMITIES: No pedal edema, cyanosis, or clubbing.  NEUROLOGIC: Cranial nerves II through XII are intact. Muscle strength 5/5 in all extremities. Sensation intact.  PSYCHIATRIC: The patient is alert and oriented x 3.  SKIN: No obvious rash, lesion, or ulcer.   LABORATORY PANEL:   CBC Recent Labs  Lab 12/08/17 1749  WBC 8.2  HGB 14.0  HCT 41.9  PLT 183  MCV 82.1  MCH 27.5  MCHC 33.4  RDW 13.5  LYMPHSABS 0.4*  MONOABS 0.5  EOSABS 0.0  BASOSABS 0.0    ------------------------------------------------------------------------------------------------------------------  Chemistries  Recent Labs  Lab 12/08/17 1749  NA 136  K 3.7  CL 103  CO2 25  GLUCOSE 115*  BUN 18  CREATININE 1.17  CALCIUM 9.7  AST 26  ALT 23  ALKPHOS 77  BILITOT 1.2   ------------------------------------------------------------------------------------------------------------------ estimated creatinine clearance is 97.6 mL/min (by C-G formula based on SCr of 1.17 mg/dL). ------------------------------------------------------------------------------------------------------------------ No results for input(s): TSH, T4TOTAL, T3FREE, THYROIDAB in the last 72 hours.  Invalid input(s): FREET3   Coagulation profile No results for input(s): INR, PROTIME in the last 168 hours. ------------------------------------------------------------------------------------------------------------------- No results for input(s): DDIMER in the last 72 hours. -------------------------------------------------------------------------------------------------------------------  Cardiac Enzymes No results for input(s): CKMB, TROPONINI, MYOGLOBIN in the last 168 hours.  Invalid input(s): CK ------------------------------------------------------------------------------------------------------------------ Invalid input(s): POCBNP  ---------------------------------------------------------------------------------------------------------------  Urinalysis    Component Value Date/Time   COLORURINE YELLOW (A) 12/08/2017 1750   APPEARANCEUR CLEAR (A) 12/08/2017 1750   APPEARANCEUR Hazy (A) 12/27/2015 1040   LABSPEC 1.025 12/08/2017 1750   PHURINE 8.0 12/08/2017 1750   GLUCOSEU NEGATIVE 12/08/2017 1750   HGBUR NEGATIVE 12/08/2017 1750   BILIRUBINUR NEGATIVE 12/08/2017 1750   BILIRUBINUR Negative 12/27/2015 1040   KETONESUR 5 (A) 12/08/2017 1750   PROTEINUR NEGATIVE 12/08/2017 1750    UROBILINOGEN 0.2 09/21/2010 0853   NITRITE NEGATIVE 12/08/2017 1750   LEUKOCYTESUR NEGATIVE 12/08/2017 1750   LEUKOCYTESUR Negative 12/27/2015 1040     RADIOLOGY: Dg Chest 2 View  Result Date: 12/08/2017 CLINICAL DATA:  27 y/o  M; fever, nausea, and vomiting. EXAM: CHEST - 2 VIEW COMPARISON:  04/14/2013 chest radiograph FINDINGS: Stable heart size and mediastinal contours are within normal limits. Both lungs are clear. The visualized skeletal structures are unremarkable. IMPRESSION: No acute pulmonary process identified. Electronically Signed   By: Mitzi Hansen M.D.   On: 12/08/2017 21:51   Ct Abdomen Pelvis W Contrast  Result Date: 12/08/2017 CLINICAL DATA:  Right lower quadrant abdomen pain with vomiting EXAM: CT ABDOMEN AND PELVIS WITH CONTRAST TECHNIQUE: Multidetector CT imaging of the abdomen and pelvis was performed using the standard protocol following bolus administration of intravenous contrast. CONTRAST:  ISOVUE-300 IOPAMIDOL (ISOVUE-300) INJECTION 61% COMPARISON:  CT 09/17/2010 FINDINGS: Lower chest: Lung bases demonstrate no acute consolidation or effusion. The heart size is within normal limits. Hepatobiliary: No focal liver abnormality is seen. No gallstones, gallbladder wall thickening, or biliary dilatation. Pancreas: Unremarkable. No pancreatic ductal dilatation or surrounding inflammatory changes. Spleen: Normal in size without focal abnormality. Adrenals/Urinary Tract: Adrenal glands are unremarkable. Kidneys are normal, without renal calculi, focal lesion, or hydronephrosis. Bladder is unremarkable. Stomach/Bowel: Stomach is within normal limits. Appendix appears normal. No evidence of bowel wall thickening, distention, or inflammatory changes. Vascular/Lymphatic: No significant vascular findings are present. No enlarged abdominal or pelvic lymph nodes. Reproductive: Prostate is unremarkable. Other: No abdominal wall hernia or abnormality. No abdominopelvic  ascites. Musculoskeletal: No acute or significant osseous findings. IMPRESSION: Negative. No CT evidence for acute intra-abdominal or pelvic abnormality. Electronically Signed   By: Jasmine Pang M.D.   On: 12/08/2017 20:20    EKG: Orders placed or performed during  the hospital encounter of 04/14/13  . EKG 12-Lead  . EKG 12-Lead  . ED EKG  (IF patient has COPD)  . ED EKG  (IF patient has COPD)  . EKG    IMPRESSION AND PLAN:   1.  Fever of unknown origin, associated with nausea and vomiting.  This could be related to recurrent malaria episode.  The initial blood smear done in the emergency room showed normal morphology.  We will keep patient in isolation.  Will check blood cultures.  We will check PCR test for malaria.  Continue supportive treatment with IV fluids, Tylenol and antinausea medications.  Will consult infectious disease for further evaluation and treatment.   All the records are reviewed and case discussed with ED provider. Management plans discussed with the patient, family and they are in agreement.  CODE STATUS: Full    TOTAL TIME TAKING CARE OF THIS PATIENT: 45 minutes.    Cammy Copa M.D on 12/08/2017 at 10:27 PM  Between 7am to 6pm - Pager - [phone]  After 6pm go to [email or web address] - password [email or web address] [facility] [place] [facility]  [phone]  CC: Primary care physician; Patient, No Pcp Per

## 2017-12-08 NOTE — ED Triage Notes (Addendum)
Pt pov from urgent care for fever, N/V. Pt was in [place] 2 months coming back 7/31. Symptoms started yesterday. Temp 103.1 here. Pt traveled from [place] by air with layover [place], [place], then [place].

## 2017-12-09 ENCOUNTER — Other Ambulatory Visit: Payer: Self-pay

## 2017-12-09 LAB — INFLUENZA PANEL BY PCR (TYPE A & B)
INFLAPCR: NEGATIVE
INFLBPCR: NEGATIVE

## 2017-12-09 LAB — RESPIRATORY PANEL BY PCR
Adenovirus: NOT DETECTED
BORDETELLA PERTUSSIS-RVPCR: NOT DETECTED
CHLAMYDOPHILA PNEUMONIAE-RVPPCR: NOT DETECTED
CORONAVIRUS 229E-RVPPCR: NOT DETECTED
Coronavirus HKU1: NOT DETECTED
Coronavirus NL63: NOT DETECTED
Coronavirus OC43: NOT DETECTED
INFLUENZA A H1 2009-RVPPR: NOT DETECTED
INFLUENZA A H1-RVPPCR: NOT DETECTED
INFLUENZA B-RVPPCR: NOT DETECTED
Influenza A H3: NOT DETECTED
Influenza A: NOT DETECTED
Metapneumovirus: NOT DETECTED
Mycoplasma pneumoniae: NOT DETECTED
PARAINFLUENZA VIRUS 3-RVPPCR: NOT DETECTED
PARAINFLUENZA VIRUS 4-RVPPCR: NOT DETECTED
Parainfluenza Virus 1: NOT DETECTED
Parainfluenza Virus 2: NOT DETECTED
RESPIRATORY SYNCYTIAL VIRUS-RVPPCR: NOT DETECTED
RHINOVIRUS / ENTEROVIRUS - RVPPCR: NOT DETECTED

## 2017-12-09 LAB — CBC
HEMATOCRIT: 36.4 % — AB (ref 40.0–52.0)
Hemoglobin: 12.5 g/dL — ABNORMAL LOW (ref 13.0–18.0)
MCH: 28.4 pg (ref 26.0–34.0)
MCHC: 34.3 g/dL (ref 32.0–36.0)
MCV: 82.9 fL (ref 80.0–100.0)
Platelets: 162 10*3/uL (ref 150–440)
RBC: 4.39 MIL/uL — ABNORMAL LOW (ref 4.40–5.90)
RDW: 13.7 % (ref 11.5–14.5)
WBC: 6.4 10*3/uL (ref 3.8–10.6)

## 2017-12-09 LAB — GLUCOSE, CAPILLARY: Glucose-Capillary: 103 mg/dL — ABNORMAL HIGH (ref 70–99)

## 2017-12-09 LAB — GROUP A STREP BY PCR: GROUP A STREP BY PCR: DETECTED — AB

## 2017-12-09 LAB — RAPID HIV SCREEN (HIV 1/2 AB+AG)
HIV 1/2 Antibodies: NONREACTIVE
HIV-1 P24 Antigen - HIV24: NONREACTIVE

## 2017-12-09 LAB — BASIC METABOLIC PANEL
Anion gap: 6 (ref 5–15)
BUN: 18 mg/dL (ref 6–20)
CO2: 27 mmol/L (ref 22–32)
Calcium: 8.3 mg/dL — ABNORMAL LOW (ref 8.9–10.3)
Chloride: 108 mmol/L (ref 98–111)
Creatinine, Ser: 1.02 mg/dL (ref 0.61–1.24)
GFR calc Af Amer: >60 mL/min (ref >60)
GLUCOSE: 147 mg/dL — AB (ref 70–99)
POTASSIUM: 4.2 mmol/L (ref 3.5–5.1)
Sodium: 141 mmol/L (ref 135–145)

## 2017-12-09 LAB — LACTIC ACID, PLASMA: LACTIC ACID, VENOUS: 0.9 mmol/L (ref 0.5–1.9)

## 2017-12-09 MED ORDER — PHENOL 1.4 % MT LIQD
1.0000 | OROMUCOSAL | Status: DC | PRN
  Filled 2017-12-09 (×2): qty 177

## 2017-12-09 MED ORDER — AMOXICILLIN 500 MG PO CAPS
500.0000 mg | ORAL_CAPSULE | Freq: Three times a day (TID) | ORAL | Status: DC
  Filled 2017-12-09 (×2): qty 1

## 2017-12-09 MED ORDER — AMOXICILLIN 500 MG PO CAPS: 500.0000 mg | ORAL_CAPSULE | Freq: Three times a day (TID) | ORAL | 0 refills | Status: AC

## 2017-12-09 NOTE — Progress Notes (Signed)
Infection Prevention   Regarding: isolation precautions IP Recommendation: pt does not require airborne or contact precautions.  If pt exhibits symptoms associated w/ GI illness, enteric precautions would be appropriate.

## 2017-12-09 NOTE — Progress Notes (Signed)
Pt alert and oriented. Complaining of headache and sore throat. He has been sleeping since admission.

## 2017-12-09 NOTE — Care Management Note (Signed)
Case Management Note  Patient Details  Name: Roger Walker MRN: [id number] Date of Birth: 02/15/1991  Subjective/Objective:  Uninsured patient. Met with patient and his girlfriend at bedside to discuss discharge planning. Provided them with a open door and medication management clinic application. Emailed Lurena Nida with referral. No further needs identified. Case closed.                   Action/Plan:   Expected Discharge Date:  12/09/17               Expected Discharge Plan:     In-House Referral:     Discharge planning Services  CM Consult, Medication Assistance, Homebound not met per provider, [facility]  Post Acute Care Choice:    Choice offered to:  Patient  DME Arranged:    DME Agency:     HH Arranged:    [place] Agency:     Status of Service:  Completed, signed off  If discussed at [employer], dates discussed:    Additional Comments:  Jolly Mango, RN 12/09/2017, 4:09 PM

## 2017-12-09 NOTE — Consult Note (Signed)
Date of Admission:  12/08/2017                 Reason for Consult: fever   Referring Provider: Dr.Salary   HPI: Roger Walker is a 27 y.o. male is admitted with sudden onset fever of 1 day duration. Pt is originally from [place] of [place] and has been in the [place] for the past 8 yrs. 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 During his summer holidays he went to visit his family in [place] ( [place] the Ebola outbreak is happening[place] and stayed for 2 months and returned to [name] on July 30th 2019. While in [place] he visited his family members and spent time at home. He sais he had fever and was diagnosed with malaria and got injections and tablets.He has been fine since then. He did not eat any raw food( bush meat) or come in contact with animals. After coming back to the [place] he went back to school. Over the labor day weekend he was off and on Monday went game shooting ( quail?) and cooked the bird himself and ate it. His brother who had gone shooting also ate the bird. He tried to fish but did not get any. He denies drinking stream water. That evening he felt cold and went to be bed early. On Tuesday mornign around 10 am he started having fever and vomiting. His whole body was hurting and he hd head ache. His wife who is a Consulting civil engineer at [employer] came back from school and gave him cold medicine but he was not getting better- He was sweating and she brought him to the ED. In the ED temp was 103.1, BP 126/75, labs were sent which were essentially normal. Malaria parasite was checked on the smear and it was negative. He had some tenderness in his lower abdomen on examiantion but did not have any abdominal pain and he got a CT abdomen which was normal  I am asked to see the patient for fever today. Pt says since this morning he has sore throat. No runny nose, appetite good, no fever since hospitlaization but he has been taking tylenol. No cough or sob or  diarrhea No insect bites other than mosquito bites in [place]. No tick bites on Monday.       Past Medical History:  Diagnosis Date  . Flank pain     No surgical history  SH Non smoker No illicit drug use Occasional wine   Family History  Problem Relation Age of Onset  . Kidney disease Neg Hx   . Prostate cancer Neg Hx      . docusate sodium  100 mg Oral BID  . heparin  5,000 Units Subcutaneous Q8H    NKDA  Abtx:  Anti-infectives (From admission, onward)   None       Review of Systems: Review of Systems  Constitutional: Positive for chills, fever and malaise/fatigue. Negative for weight loss.  HENT: Positive for sore throat. Negative for hearing loss and sinus pain.   Eyes: Negative for blurred vision.  Respiratory: Negative for cough and shortness of breath.   Cardiovascular: Negative for chest pain, orthopnea and leg swelling.  Gastrointestinal: Positive for nausea and vomiting. Negative for abdominal pain and diarrhea.  Genitourinary: Negative for dysuria, frequency and urgency.  Musculoskeletal: Positive for back pain and myalgias. Negative for falls.  Skin: Negative for itching and rash.  Neurological: Positive for weakness and headaches. Negative for  dizziness, focal weakness and seizures.  Endo/Heme/Allergies: Does not bruise/bleed easily.  Psychiatric/Behavioral: Negative for depression.      No Known Allergies  OBJECTIVE: Blood pressure 121/70, pulse 79, temperature 98.8 F (37.1 C), temperature source Oral, resp. rate 18, height 5\' 7"  (1.702 m), weight 80 kg, SpO2 100 %.  Physical Exam  Constitutional: He is oriented to person, place, and time. He appears well-developed. No distress.  HENT:  Head: Normocephalic and atraumatic.  enlarged tonsils- some inflammation and white striae,   Neck: Normal range of motion. Neck supple.  Cardiovascular: Normal rate and regular rhythm.  No murmur heard. Pulmonary/Chest: Effort normal and breath  sounds normal. He has no rales.  Abdominal: Soft. He exhibits no distension and no mass. There is no tenderness.  Musculoskeletal: Normal range of motion.  Lymphadenopathy:    He has no cervical adenopathy.  Neurological: He is alert and oriented to person, place, and time.  Non focal examination  Skin: Skin is warm.  Psychiatric: He has a normal mood and affect.      Lab Results CBC    Component Value Date/Time   WBC 6.4 12/09/2017 0318   RBC 4.39 (L) 12/09/2017 0318   HGB 12.5 (L) 12/09/2017 0318   HCT 36.4 (L) 12/09/2017 0318   PLT 162 12/09/2017 0318   MCV 82.9 12/09/2017 0318   MCH 28.4 12/09/2017 0318   MCHC 34.3 12/09/2017 0318   RDW 13.7 12/09/2017 0318   LYMPHSABS 0.4 (L) 12/08/2017 1749   MONOABS 0.5 12/08/2017 1749   EOSABS 0.0 12/08/2017 1749   BASOSABS 0.0 12/08/2017 1749    CMP Latest Ref Rng & Units 12/09/2017 12/08/2017 12/27/2015  Glucose 70 - 99 mg/dL 161(W) 960(A) 82  BUN 6 - 20 mg/dL 18 18 14   Creatinine 0.61 - 1.24 mg/dL 5.40 9.81 1.91  Sodium 135 - 145 mmol/L 141 136 141  Potassium 3.5 - 5.1 mmol/L 4.2 3.7 4.1  Chloride 98 - 111 mmol/L 108 103 99  CO2 22 - 32 mmol/L 27 25 27   Calcium 8.9 - 10.3 mg/dL 8.3(L) 9.7 9.9  Total Protein 6.5 - 8.1 g/dL - 8.1 7.3  Total Bilirubin 0.3 - 1.2 mg/dL - 1.2 0.7  Alkaline Phos 38 - 126 U/L - 77 86  AST 15 - 41 U/L - 26 15  ALT 0 - 44 U/L - 23 14   HIV NR UA 0-5 WBC  Microbiology: Recent Results (from the past 240 hour(s))  Culture, blood (routine x 2)     Status: None (Preliminary result)   Collection Time: 12/08/17  5:50 PM  Result Value Ref Range Status   Specimen Description BLOOD RIGHT ANTECUBITAL  Final   Special Requests   Final    BOTTLES DRAWN AEROBIC AND ANAEROBIC Blood Culture adequate volume   Culture   Final    NO GROWTH < 12 HOURS Performed at [facility], [address] [place], [place] [address]    Report Status PENDING  Incomplete  Culture, blood (routine x 2)     Status:  None (Preliminary result)   Collection Time: 12/08/17  5:55 PM  Result Value Ref Range Status   Specimen Description BLOOD LEFT ANTECUBITAL  Final   Special Requests   Final    BOTTLES DRAWN AEROBIC AND ANAEROBIC Blood Culture results may not be optimal due to an excessive volume of blood received in culture bottles   Culture   Final    NO GROWTH < 12 HOURS Performed at [facility]  [facility], [address], [place], [place] [address]    Report Status PENDING  Incomplete    Radiographs and labs were personally reviewed by me.   Assessment and Plan 27 y.o. male is admitted with sudden onset fever of 1 day duration. Pt is originally from [place] of [place] and has been in the [place] for the past 8 yrs. 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 During his summer holidays he went to visit his family in [place] ( [place] the Ebola outbreak is happening[place] and stayed for 2 months and returned to [name] on July 30th 2019. While in [place] he visited his family members and spent time at home. He sais he had fever and was diagnosed with malaria and got injections and tablets.He has been fine since then. He did not eat any raw food( bush meat) or come in contact with animals. After coming back to the [place] he went back to school. Over the labor day weekend he was off and on Monday went game shooting ( quail?) and cooked the bird himself and ate it. His brother who had gone shooting also ate the bird. He tried to fish but did not get any. He denies drinking stream water. That evening he felt cold and went to be bed early. On Tuesday mornign around 10 am he started having fever and vomiting. His whole body was hurting and he hd head ache. His wife who is a Consulting civil engineer at [employer] came back from school and gave him cold medicine but he was not getting better- He was sweating and she brought him to the ED. In the ED temp was 103.1, BP 126/75, labs were sent which were  essentially normal. Malaria parasite was checked on the smear and it was negative. He had some tenderness in his lower abdomen on examiantion but did not have any abdominal pain and he got a CT abdomen which was normal  I am asked to see the patient for fever today. Pt says since this morning he has sore throat. No runny nose, appetite good, no fever since hospitlaization but he has been taking tylenol. No cough or sob or diarrhea No insect bites other than mosquito bites in [place]. No tick bites on Monday.  Fever with body ache  The presentation could fit very well with Malaria in a recent returning traveler from [place] who did not take malarial prophylaxis during his stay and also was treated as malaria while he was there. Falciparum ovale ( or vivax) would be a concern as they have a liver stage and can recur later if not treated with primaquine. But his malarial smear is negative ( I reviewed the slide with the chief tech in the lab)  Painful throat started 24 hrs later with tonsillar enlargement with some erythema - there are white striae in both tonsils but no pustules. Group A strep is a concern especially with no runny nose or cough to suggest of another viral pharyngitis.  Other D.D would be some infection associated with  eating the Game ( quail) but incubation way too soon. Other infections from his travel to [place] like salmonella, ebola are all unlikely because of incubation period of 31 days and also location.  Plan  Group A strep PCR Resp viral PCR If one  of the above  is positive we will have an answer.  If both negative then we will have to wait for Malarial PCR result (has been  sent.) or if fever recurs will need repeat malarial smear and may be empirically treated . Pt is stable and he need not stay in the hospital once the GAS and resp viral PCR is back. Can be managed as OP. Discussed the management with patient and his wife in great detail  Lynn Ito, MD    12/09/2017, 1:00 PM

## 2017-12-09 NOTE — Progress Notes (Signed)
[employer] [facility] [facility] [place] [facility]   PATIENT NAME: Roger Walker    MR#:  [id number]  DATE OF BIRTH:  1990/08/25  SUBJECTIVE:  CHIEF COMPLAINT:   Chief Complaint  Patient presents with  . Fever  . Nausea  Patient feeling better today, nausea/vomiting has all resolved, patient only complaining of sore throat started this morning, start Chloraseptic spray as needed, check throat culture, follow-up on ID recommendations/evaluation  REVIEW OF SYSTEMS:  CONSTITUTIONAL: No fever, fatigue or weakness.  EYES: No blurred or double vision.  EARS, NOSE, AND THROAT: No tinnitus or ear pain.  RESPIRATORY: No cough, shortness of breath, wheezing or hemoptysis.  CARDIOVASCULAR: No chest pain, orthopnea, edema.  GASTROINTESTINAL: No nausea, vomiting, diarrhea or abdominal pain.  GENITOURINARY: No dysuria, hematuria.  ENDOCRINE: No polyuria, nocturia,  HEMATOLOGY: No anemia, easy bruising or bleeding SKIN: No rash or lesion. MUSCULOSKELETAL: No joint pain or arthritis.   NEUROLOGIC: No tingling, numbness, weakness.  PSYCHIATRY: No anxiety or depression.   ROS  DRUG ALLERGIES:  No Known Allergies  VITALS:  Blood pressure 121/70, pulse 79, temperature 98.8 F (37.1 C), temperature source Oral, resp. rate 18, height 5\' 7"  (1.702 m), weight 80 kg, SpO2 100 %.  PHYSICAL EXAMINATION:  GENERAL:  27 y.o.-year-old patient lying in the bed with no acute distress.  EYES: Pupils equal, round, reactive to light and accommodation. No scleral icterus. Extraocular muscles intact.  HEENT: Head atraumatic, normocephalic. Oropharynx and nasopharynx clear.  NECK:  Supple, no jugular venous distention. No thyroid enlargement, no tenderness.  LUNGS: Normal breath sounds bilaterally, no wheezing, rales,rhonchi or crepitation. No use of accessory muscles of respiration.  CARDIOVASCULAR: S1, S2 normal. No murmurs, rubs, or gallops.  ABDOMEN: Soft, nontender, nondistended. Bowel sounds present.  No organomegaly or mass.  EXTREMITIES: No pedal edema, cyanosis, or clubbing.  NEUROLOGIC: Cranial nerves II through XII are intact. Muscle strength 5/5 in all extremities. Sensation intact. Gait not checked.  PSYCHIATRIC: The patient is alert and oriented x 3.  SKIN: No obvious rash, lesion, or ulcer.   Physical Exam LABORATORY PANEL:   CBC Recent Labs  Lab 12/09/17 0318  WBC 6.4  HGB 12.5*  HCT 36.4*  PLT 162   ------------------------------------------------------------------------------------------------------------------  Chemistries  Recent Labs  Lab 12/08/17 1749 12/09/17 0318  NA 136 141  K 3.7 4.2  CL 103 108  CO2 25 27  GLUCOSE 115* 147*  BUN 18 18  CREATININE 1.17 1.02  CALCIUM 9.7 8.3*  AST 26  --   ALT 23  --   ALKPHOS 77  --   BILITOT 1.2  --    ------------------------------------------------------------------------------------------------------------------  Cardiac Enzymes No results for input(s): TROPONINI in the last 168 hours. ------------------------------------------------------------------------------------------------------------------  RADIOLOGY:  Dg Chest 2 View  Result Date: 12/08/2017 CLINICAL DATA:  27 y/o  M; fever, nausea, and vomiting. EXAM: CHEST - 2 VIEW COMPARISON:  04/14/2013 chest radiograph FINDINGS: Stable heart size and mediastinal contours are within normal limits. Both lungs are clear. The visualized skeletal structures are unremarkable. IMPRESSION: No acute pulmonary process identified. Electronically Signed   By: Mitzi Hansen M.D.   On: 12/08/2017 21:51   Ct Abdomen Pelvis W Contrast  Result Date: 12/08/2017 CLINICAL DATA:  Right lower quadrant abdomen pain with vomiting EXAM: CT ABDOMEN AND PELVIS WITH CONTRAST TECHNIQUE: Multidetector CT imaging of the abdomen and pelvis was performed using the standard protocol following bolus administration of intravenous contrast. CONTRAST:  ISOVUE-300 IOPAMIDOL  (ISOVUE-300) INJECTION 61% COMPARISON:  CT 09/17/2010 FINDINGS: Lower chest: Lung bases demonstrate no acute consolidation or effusion. The heart size is within normal limits. Hepatobiliary: No focal liver abnormality is seen. No gallstones, gallbladder wall thickening, or biliary dilatation. Pancreas: Unremarkable. No pancreatic ductal dilatation or surrounding inflammatory changes. Spleen: Normal in size without focal abnormality. Adrenals/Urinary Tract: Adrenal glands are unremarkable. Kidneys are normal, without renal calculi, focal lesion, or hydronephrosis. Bladder is unremarkable. Stomach/Bowel: Stomach is within normal limits. Appendix appears normal. No evidence of bowel wall thickening, distention, or inflammatory changes. Vascular/Lymphatic: No significant vascular findings are present. No enlarged abdominal or pelvic lymph nodes. Reproductive: Prostate is unremarkable. Other: No abdominal wall hernia or abnormality. No abdominopelvic ascites. Musculoskeletal: No acute or significant osseous findings. IMPRESSION: Negative. No CT evidence for acute intra-abdominal or pelvic abnormality. Electronically Signed   By: Jasmine Pang M.D.   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 Management plans discussed with the patient, family and they are in agreement.  CODE STATUS: full  TOTAL TIME TAKING CARE OF THIS PATIENT: 40 minutes.     POSSIBLE D/C IN 1-2 DAYS, DEPENDING ON CLINICAL CONDITION.   Evelena Asa Ivyanna Sibert M.D on 12/09/2017   Between 7am to 6pm - Pager - [phone]  After 6pm go to [email or web address] - password [email or web address]  [email or web address]  [facility]  Office  [phone]  CC: Primary care physician; Patient, No Pcp Per  Note: This dictation was prepared with Dragon dictation along with smaller phrase technology. Any transcriptional errors that result from this process are unintentional.

## 2017-12-09 NOTE — Discharge Summary (Signed)
[facility] [place] [facility]   PATIENT NAME: Roger Walker    MR#:  [id number]  DATE OF BIRTH:  05-18-1990  DATE OF ADMISSION:  12/08/2017 ADMITTING PHYSICIAN: Cammy Copa, MD  DATE OF DISCHARGE: No discharge date for patient encounter.  PRIMARY CARE PHYSICIAN: Patient, No Pcp Per    ADMISSION DIAGNOSIS:  Fever, unspecified fever cause [R50.9]  DISCHARGE DIAGNOSIS:  Active Problems:   Fever   SECONDARY DIAGNOSIS:   Past Medical History:  Diagnosis Date  . Flank pain     HOSPITAL COURSE:   Acute strep infection Resolving Amoxicillin for 10day course, f/u with PCP in days for re-eval, seen by ID while in house, malaira ruled out  DISCHARGE CONDITIONS:   stable  CONSULTS OBTAINED:  Treatment Team:  Lynn Ito, MD  DRUG ALLERGIES:  No Known Allergies  DISCHARGE MEDICATIONS:   Allergies as of 12/09/2017   No Known Allergies     Medication List    TAKE these medications   amoxicillin 500 MG capsule Commonly known as:  AMOXIL Take 1 capsule (500 mg total) by mouth 3 (three) times daily.        DISCHARGE INSTRUCTIONS:   If you experience worsening of your admission symptoms, develop shortness of breath, life threatening emergency, suicidal or homicidal thoughts you must seek medical attention immediately by calling 911 or calling your MD immediately  if symptoms less severe.  You Must read complete instructions/literature along with all the possible adverse reactions/side effects for all the Medicines you take and that have been prescribed to you. Take any new Medicines after you have completely understood and accept all the possible adverse reactions/side effects.   Please note  You were cared for by a hospitalist during your hospital stay. If you have any questions about your discharge medications or the care you received while you were in the hospital after you are discharged, you can call the unit and asked  to speak with the hospitalist on call if the hospitalist that took care of you is not available. Once you are discharged, your primary care physician will handle any further medical issues. Please note that NO REFILLS for any discharge medications will be authorized once you are discharged, as it is imperative that you return to your primary care physician (or establish a relationship with a primary care physician if you do not have one) for your aftercare needs so that they can reassess your need for medications and monitor your lab values.    Today   CHIEF COMPLAINT:   Chief Complaint  Patient presents with  . Fever  . Nausea    HISTORY OF PRESENT ILLNESS:  27 y.o. male without significant medical history. Patient presented to emergency room for acute onset of nausea vomiting, fever and chills going on for the past 24 hours.  The highest temperature at home was 103.  No bleeding, no diarrhea.  No recent antibiotic use.  He was recently visiting [place], in the [place], until July 29.  Patient did not take any prophylactic medication for malaria and he actually did develop malaria once, while he was there.  He was treated for that episode and he has been doing well since he came back to [place].  Patient denies any sick contacts, including sick contacts with Ebola virus. Blood test done emergency room including CBC and CMP are grossly unremarkable.  Lactic acid level is 1.4.  UA is negative for UTI.  Chest x-ray is  negative for any acute cardiopulmonary abnormalities. Abdominal CAT scan is negative for any acute abnormalities. Patient is admitted for further evaluation and treatment.  VITAL SIGNS:  Blood pressure 121/70, pulse 79, temperature 98.8 F (37.1 C), temperature source Oral, resp. rate 18, height 5\' 7"  (1.702 m), weight 80 kg, SpO2 100 %.  I/O:    Intake/Output Summary (Last 24 hours) at 12/09/2017 1458 Last data filed at 12/09/2017 0424 Gross per 24 hour  Intake 341.02  ml  Output -  Net 341.02 ml    PHYSICAL EXAMINATION:  GENERAL:  27 y.o.-year-old patient lying in the bed with no acute distress.  EYES: Pupils equal, round, reactive to light and accommodation. No scleral icterus. Extraocular muscles intact.  HEENT: Head atraumatic, normocephalic. Oropharynx and nasopharynx clear.  NECK:  Supple, no jugular venous distention. No thyroid enlargement, no tenderness.  LUNGS: Normal breath sounds bilaterally, no wheezing, rales,rhonchi or crepitation. No use of accessory muscles of respiration.  CARDIOVASCULAR: S1, S2 normal. No murmurs, rubs, or gallops.  ABDOMEN: Soft, non-tender, non-distended. Bowel sounds present. No organomegaly or mass.  EXTREMITIES: No pedal edema, cyanosis, or clubbing.  NEUROLOGIC: Cranial nerves II through XII are intact. Muscle strength 5/5 in all extremities. Sensation intact. Gait not checked.  PSYCHIATRIC: The patient is alert and oriented x 3.  SKIN: No obvious rash, lesion, or ulcer.   DATA REVIEW:   CBC Recent Labs  Lab 12/09/17 0318  WBC 6.4  HGB 12.5*  HCT 36.4*  PLT 162    Chemistries  Recent Labs  Lab 12/08/17 1749 12/09/17 0318  NA 136 141  K 3.7 4.2  CL 103 108  CO2 25 27  GLUCOSE 115* 147*  BUN 18 18  CREATININE 1.17 1.02  CALCIUM 9.7 8.3*  AST 26  --   ALT 23  --   ALKPHOS 77  --   BILITOT 1.2  --     Cardiac Enzymes No results for input(s): TROPONINI in the last 168 hours.  Microbiology Results  Results for orders placed or performed during the hospital encounter of 12/08/17  Culture, blood (routine x 2)     Status: None (Preliminary result)   Collection Time: 12/08/17  5:50 PM  Result Value Ref Range Status   Specimen Description BLOOD RIGHT ANTECUBITAL  Final   Special Requests   Final    BOTTLES DRAWN AEROBIC AND ANAEROBIC Blood Culture adequate volume   Culture   Final    NO GROWTH < 12 HOURS Performed at [facility], [address] [place], [place] [address]     Report Status PENDING  Incomplete  Culture, blood (routine x 2)     Status: None (Preliminary result)   Collection Time: 12/08/17  5:55 PM  Result Value Ref Range Status   Specimen Description BLOOD LEFT ANTECUBITAL  Final   Special Requests   Final    BOTTLES DRAWN AEROBIC AND ANAEROBIC Blood Culture results may not be optimal due to an excessive volume of blood received in culture bottles   Culture   Final    NO GROWTH < 12 HOURS Performed at [facility], [address] [place], [place] [address]    Report Status PENDING  Incomplete  Group A Strep by PCR     Status: Abnormal   Collection Time: 12/09/17 12:59 PM  Result Value Ref Range Status   Group A Strep by PCR DETECTED (A) NOT DETECTED Final    Comment: Performed at [facility], [address]  [address] [place], [place] [address]    RADIOLOGY:  Dg Chest 2 View  Result Date: 12/08/2017 CLINICAL DATA:  27 y/o  M; fever, nausea, and vomiting. EXAM: CHEST - 2 VIEW COMPARISON:  04/14/2013 chest radiograph FINDINGS: Stable heart size and mediastinal contours are within normal limits. Both lungs are clear. The visualized skeletal structures are unremarkable. IMPRESSION: No acute pulmonary process identified. Electronically Signed   By: Mitzi Hansen M.D.   On: 12/08/2017 21:51   Ct Abdomen Pelvis W Contrast  Result Date: 12/08/2017 CLINICAL DATA:  Right lower quadrant abdomen pain with vomiting EXAM: CT ABDOMEN AND PELVIS WITH CONTRAST TECHNIQUE: Multidetector CT imaging of the abdomen and pelvis was performed using the standard protocol following bolus administration of intravenous contrast. CONTRAST:  ISOVUE-300 IOPAMIDOL (ISOVUE-300) INJECTION 61% COMPARISON:  CT 09/17/2010 FINDINGS: Lower chest: Lung bases demonstrate no acute consolidation or effusion. The heart size is within normal limits. Hepatobiliary: No focal liver abnormality is seen. No gallstones, gallbladder wall thickening, or biliary dilatation.  Pancreas: Unremarkable. No pancreatic ductal dilatation or surrounding inflammatory changes. Spleen: Normal in size without focal abnormality. Adrenals/Urinary Tract: Adrenal glands are unremarkable. Kidneys are normal, without renal calculi, focal lesion, or hydronephrosis. Bladder is unremarkable. Stomach/Bowel: Stomach is within normal limits. Appendix appears normal. No evidence of bowel wall thickening, distention, or inflammatory changes. Vascular/Lymphatic: No significant vascular findings are present. No enlarged abdominal or pelvic lymph nodes. Reproductive: Prostate is unremarkable. Other: No abdominal wall hernia or abnormality. No abdominopelvic ascites. Musculoskeletal: No acute or significant osseous findings. IMPRESSION: Negative. No CT evidence for acute intra-abdominal or pelvic abnormality. Electronically Signed   By: Jasmine Pang M.D.   On: 12/08/2017 20:20    EKG:   Orders placed or performed during the hospital encounter of 04/14/13  . EKG 12-Lead  . EKG 12-Lead  . ED EKG  (IF patient has COPD)  . ED EKG  (IF patient has COPD)  . EKG      Management plans discussed with the patient, family and they are in agreement.  CODE STATUS:     Code Status Orders  (From admission, onward)         Start     Ordered   12/08/17 2337  Full code  Continuous     12/08/17 2336        Code Status History    This patient has a current code status but no historical code status.      TOTAL TIME TAKING CARE OF THIS PATIENT: 45 minutes.    Evelena Asa Averiana Clouatre M.D on 12/09/2017 at 2:58 PM  Between 7am to 6pm - Pager - [phone]  After 6pm go to [email or web address] - password [email or web address]  [facility]  Office  [phone]  CC: Primary care physician; Patient, No Pcp Per   Note: This dictation was prepared with Dragon dictation along with smaller phrase technology. Any transcriptional errors that result from this process are unintentional.

## 2017-12-10 LAB — GLUCOSE 6 PHOSPHATE DEHYDROGENASE
G6PDH: 10.9 U/g{Hb} (ref 4.6–13.5)
HEMOGLOBIN: 12.4 g/dL — AB (ref 13.0–17.7)

## 2017-12-10 LAB — URINE CULTURE: CULTURE: NO GROWTH

## 2017-12-11 LAB — PLASMODIUM SP. PCR: Plasmodium Sp. PCR: NEGATIVE

## 2017-12-13 LAB — CULTURE, BLOOD (ROUTINE X 2)
Culture: NO GROWTH
Culture: NO GROWTH
Special Requests: ADEQUATE

## 2017-12-16 ENCOUNTER — Telehealth: Payer: Self-pay

## 2017-12-16 NOTE — Telephone Encounter (Signed)
EMMI Follow-up: Noted on the report that the patient responded that he didn't have a follow-up appointment.  I called Roger Walker and reminded him to complete the [facility] application if he needed a follow-up appointment.  I also let him know there would be a second automated call with a different series of questions and to let us know if he had any concerns at that time.  No needs noted for today.

## 2020-06-03 IMAGING — CT CT ABD-PELV W/ CM
2 of 4 series · 16 of 46 positions shown, 18 images · IV contrast (iopamidol)
Comparison: CT 09/17/2010

CLINICAL DATA: Right lower quadrant abdomen pain with vomiting

EXAM:
CT ABDOMEN AND PELVIS WITH CONTRAST
TECHNIQUE: Multidetector CT imaging of the abdomen and pelvis was performed
using the standard protocol following bolus administration of
intravenous contrast.
CONTRAST:  100mL [id number] IOPAMIDOL ([id number]) INJECTION 61%

[Series 2: routine abd/pel with · axial · 0.69mm/px · z∈[-696,-241]mm · 13 of 99 slices shown, 15 images]
[im 4/99  soft-tissue]
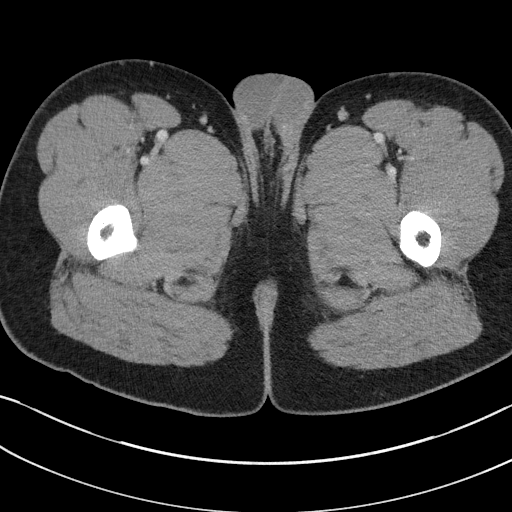
[im 4/99  bone]
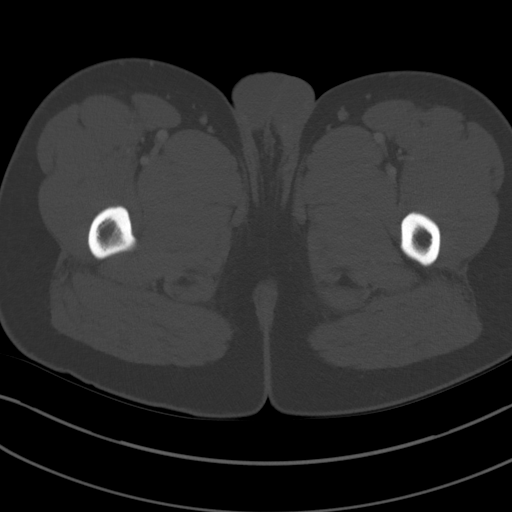
[im 12/99  soft-tissue]
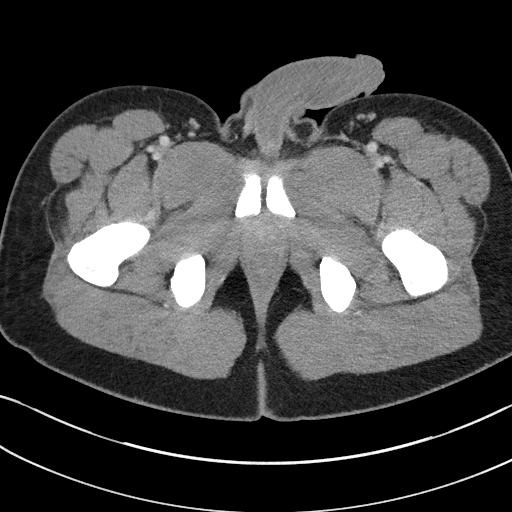
[im 20/99  soft-tissue]
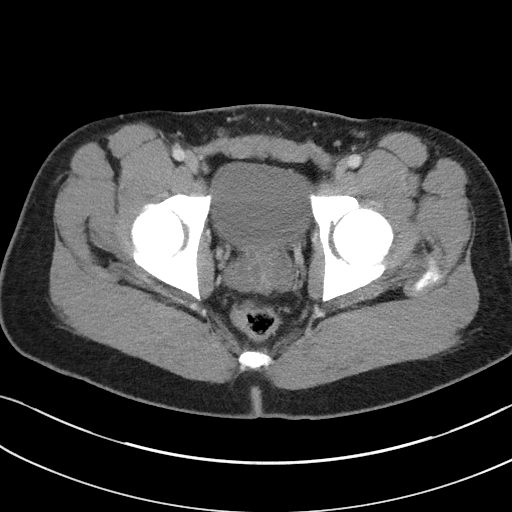
[im 28/99  soft-tissue]
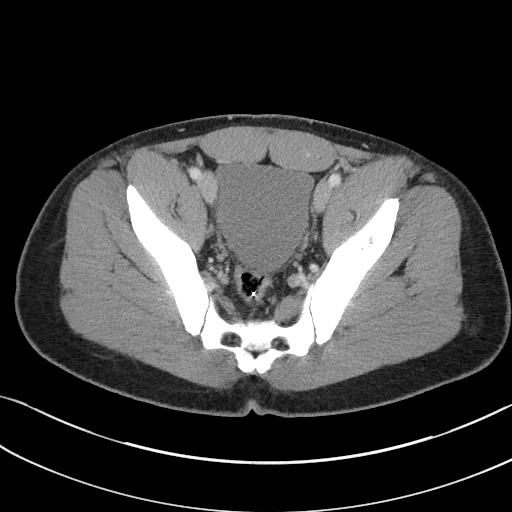
[im 36/99  soft-tissue]
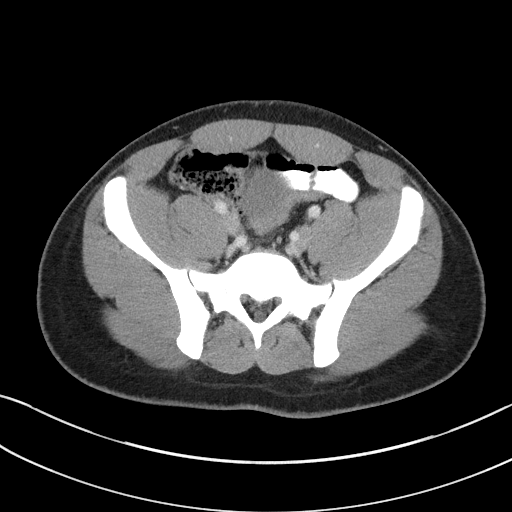
[im 44/99  soft-tissue]
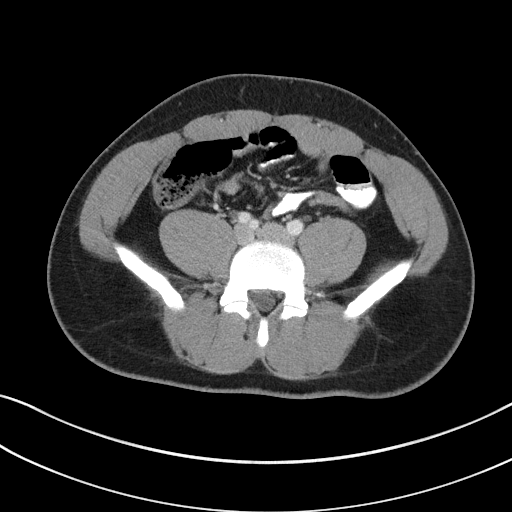
[im 51/99  soft-tissue]
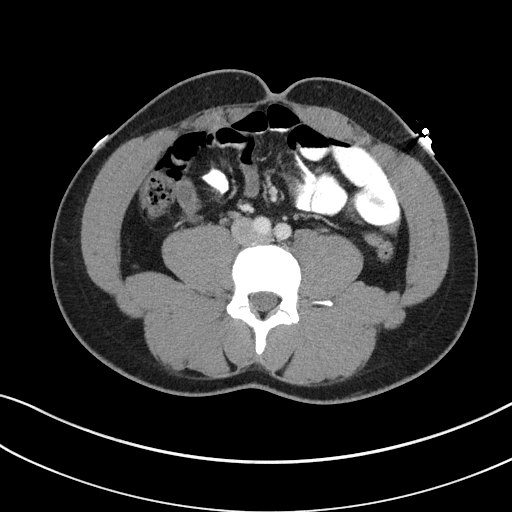
[im 55/99  soft-tissue]
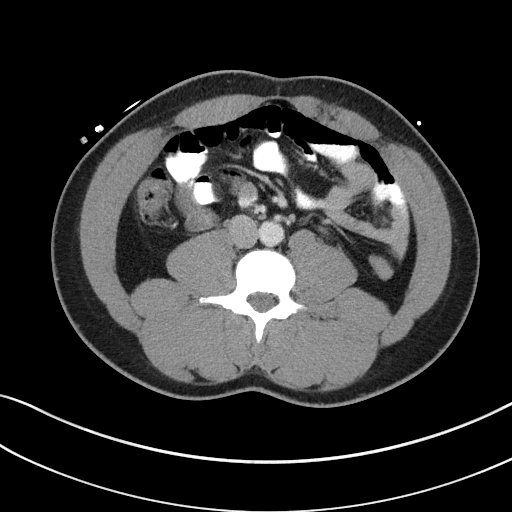
[im 63/99  soft-tissue]
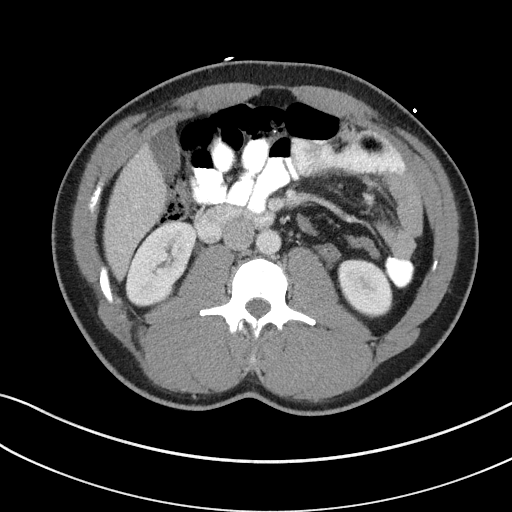
[im 63/99  bone]
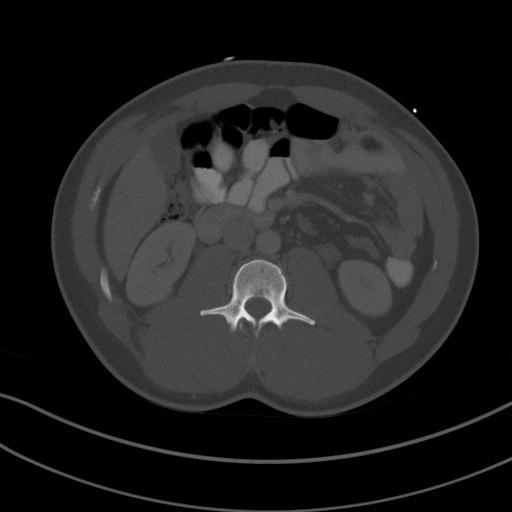
[im 71/99  soft-tissue]
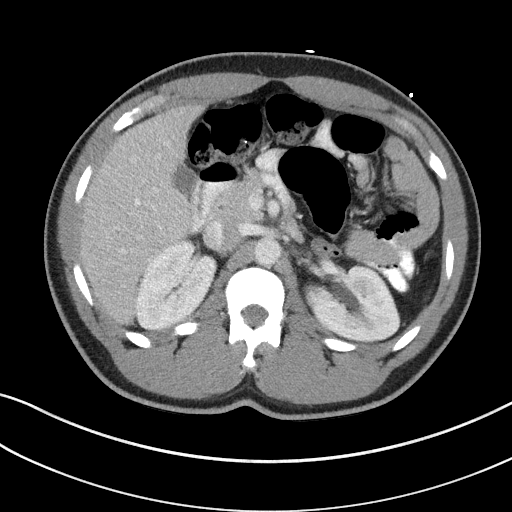
[im 79/99  soft-tissue]
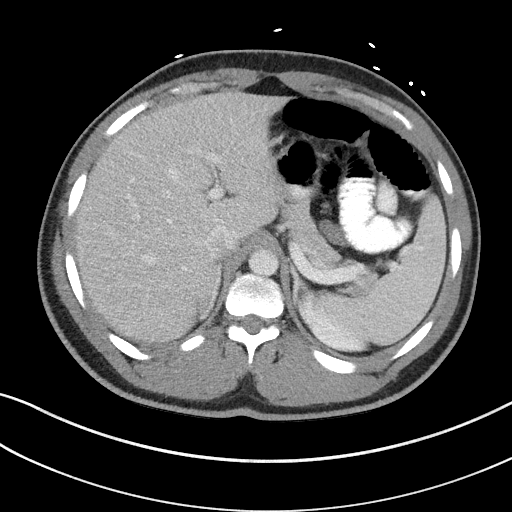
[im 87/99  soft-tissue]
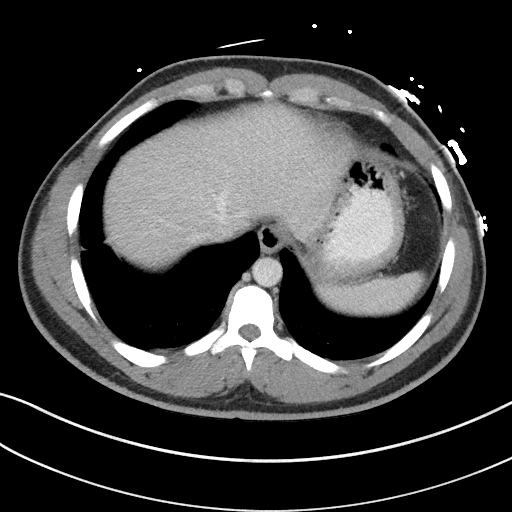
[im 95/99  soft-tissue]
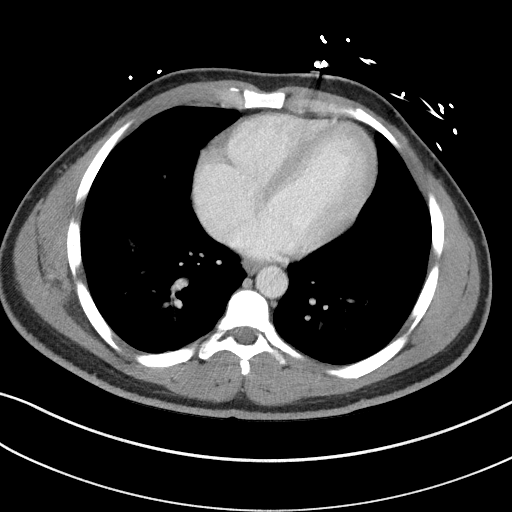

[Series 5: coronal st · coronal · 0.68mm/px · 3 of 77 slices shown]
[im 26/77  soft-tissue]
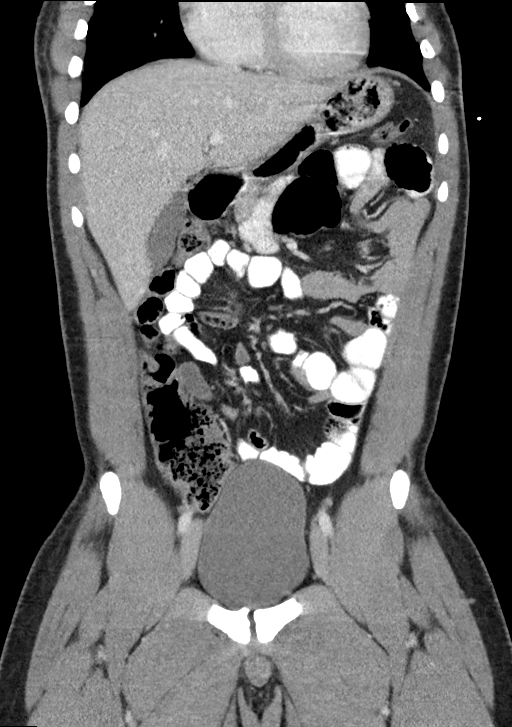
[im 34/77  soft-tissue]
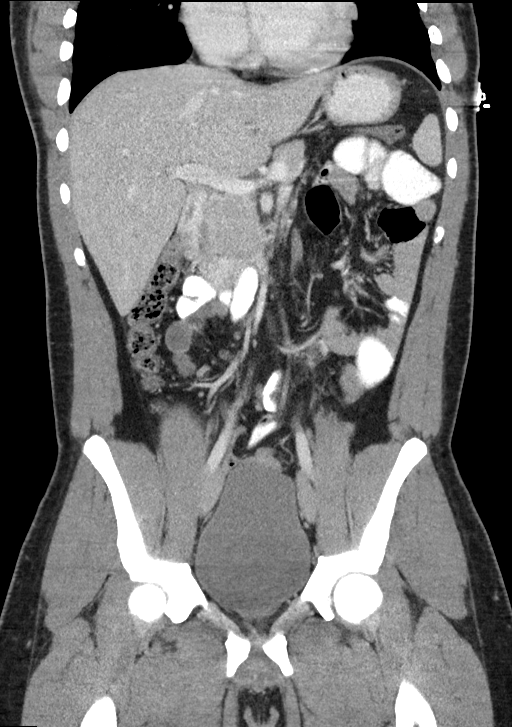
[im 43/77  soft-tissue]
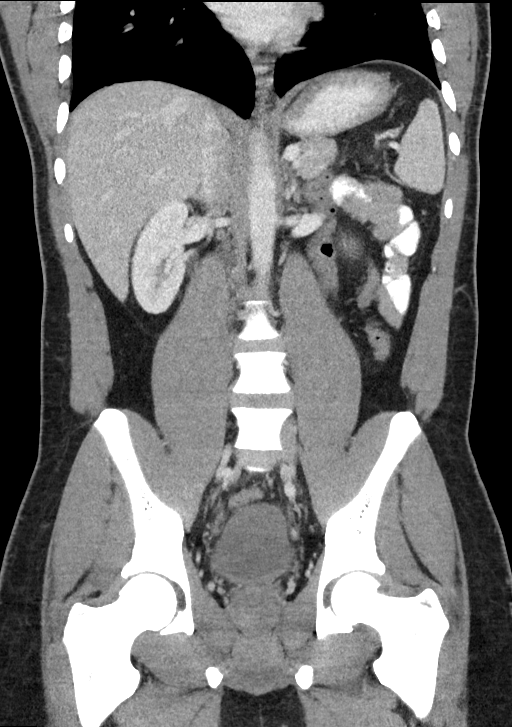

[16 of 46 positions shown; findings below may reference images not displayed]

FINDINGS: Lower chest: Lung bases demonstrate no acute consolidation or
effusion. The heart size is within normal limits.

Hepatobiliary: No focal liver abnormality is seen. No gallstones,
gallbladder wall thickening, or biliary dilatation.

Pancreas: Unremarkable. No pancreatic ductal dilatation or
surrounding inflammatory changes.

Spleen: Normal in size without focal abnormality.

Adrenals/Urinary Tract: Adrenal glands are unremarkable. Kidneys are
normal, without renal calculi, focal lesion, or hydronephrosis.
Bladder is unremarkable.

Stomach/Bowel: Stomach is within normal limits. Appendix appears
normal. No evidence of bowel wall thickening, distention, or
inflammatory changes.

Vascular/Lymphatic: No significant vascular findings are present. No
enlarged abdominal or pelvic lymph nodes.

Reproductive: Prostate is unremarkable.

Other: No abdominal wall hernia or abnormality. No abdominopelvic
ascites.

Musculoskeletal: No acute or significant osseous findings.
IMPRESSION: Negative. No CT evidence for acute intra-abdominal or pelvic
abnormality.
